# Patient Record
Sex: Male | Born: 2000 | Race: Black or African American | Hispanic: No | Marital: Single | Smoking: Current some day smoker
Health system: Southern US, Community
[De-identification: ages and names within clinical notes are randomized; demographics above are authoritative.]

## PROBLEM LIST (undated history)

## (undated) DIAGNOSIS — L509 Urticaria, unspecified: Secondary | ICD-10-CM

## (undated) DIAGNOSIS — I10 Essential (primary) hypertension: Secondary | ICD-10-CM

## (undated) HISTORY — DX: Urticaria, unspecified: L50.9

---
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
## 2019-04-20 ENCOUNTER — Emergency Department (HOSPITAL_COMMUNITY)
Admission: EM | Admit: 2019-04-20 | Discharge: 2019-04-20 | Disposition: A | Discharge status: Home / Self Care | Payer: Self-pay | Attending: Emergency Medicine | Admitting: Emergency Medicine

## 2019-04-20 ENCOUNTER — Other Ambulatory Visit: Payer: Self-pay

## 2019-04-20 ENCOUNTER — Encounter (HOSPITAL_COMMUNITY): Payer: Self-pay | Admitting: Emergency Medicine

## 2019-04-20 ENCOUNTER — Emergency Department (HOSPITAL_COMMUNITY): Payer: Self-pay

## 2019-04-20 DIAGNOSIS — F12188 Cannabis abuse with other cannabis-induced disorder: Secondary | ICD-10-CM | POA: Insufficient documentation

## 2019-04-20 DIAGNOSIS — R1012 Left upper quadrant pain: Secondary | ICD-10-CM

## 2019-04-20 DIAGNOSIS — I10 Essential (primary) hypertension: Secondary | ICD-10-CM | POA: Insufficient documentation

## 2019-04-20 DIAGNOSIS — R072 Precordial pain: Secondary | ICD-10-CM | POA: Insufficient documentation

## 2019-04-20 DIAGNOSIS — R112 Nausea with vomiting, unspecified: Secondary | ICD-10-CM

## 2019-04-20 LAB — BASIC METABOLIC PANEL
Anion gap: 14 (ref 5–15)
BUN: 13 mg/dL (ref 6–20)
CO2: 22 mmol/L (ref 22–32)
Calcium: 9.6 mg/dL (ref 8.9–10.3)
Chloride: 105 mmol/L (ref 98–111)
Creatinine, Ser: 1.18 mg/dL (ref 0.61–1.24)
GFR calc Af Amer: >60 mL/min (ref >60)
GFR calc non Af Amer: >60 mL/min (ref >60)
Glucose, Bld: 134 mg/dL — ABNORMAL HIGH (ref 70–99)
Potassium: 3.6 mmol/L (ref 3.5–5.1)
Sodium: 141 mmol/L (ref 135–145)

## 2019-04-20 LAB — HEPATIC FUNCTION PANEL
ALT: 41 U/L (ref 0–44)
AST: 26 U/L (ref 15–41)
Albumin: 2.4 g/dL — ABNORMAL LOW (ref 3.5–5.0)
Alkaline Phosphatase: 154 U/L — ABNORMAL HIGH (ref 38–126)
Bilirubin, Direct: <0.1 mg/dL (ref 0.0–0.2)
Total Bilirubin: 0.5 mg/dL (ref 0.3–1.2)
Total Protein: 7.1 g/dL (ref 6.5–8.1)

## 2019-04-20 LAB — CBC
HCT: 43.1 % (ref 39.0–52.0)
Hemoglobin: 14.8 g/dL (ref 13.0–17.0)
MCH: 29.2 pg (ref 26.0–34.0)
MCHC: 34.3 g/dL (ref 30.0–36.0)
MCV: 85 fL (ref 80.0–100.0)
Platelets: 276 10*3/uL (ref 150–400)
RBC: 5.07 MIL/uL (ref 4.22–5.81)
RDW: 13 % (ref 11.5–15.5)
WBC: 18.2 10*3/uL — ABNORMAL HIGH (ref 4.0–10.5)
nRBC: 0 % (ref 0.0–0.2)

## 2019-04-20 LAB — RAPID URINE DRUG SCREEN, HOSP PERFORMED
Amphetamines: NOT DETECTED
Barbiturates: NOT DETECTED
Benzodiazepines: NOT DETECTED
Cocaine: NOT DETECTED
Opiates: NOT DETECTED
Tetrahydrocannabinol: POSITIVE — AB

## 2019-04-20 LAB — LIPASE, BLOOD: Lipase: 96 U/L — ABNORMAL HIGH (ref 11–51)

## 2019-04-20 LAB — TROPONIN I (HIGH SENSITIVITY)
Troponin I (High Sensitivity): 3 ng/L (ref <18)
Troponin I (High Sensitivity): 3 ng/L (ref <18)

## 2019-04-20 MED ORDER — SODIUM CHLORIDE 0.9 % IV BOLUS
1000.0000 mL | Freq: Once | INTRAVENOUS | Status: AC
  Administered 2019-04-20: 1000 mL via INTRAVENOUS

## 2019-04-20 MED ORDER — IOHEXOL 300 MG/ML  SOLN
100.0000 mL | Freq: Once | INTRAMUSCULAR | Status: AC | PRN
  Administered 2019-04-20: 100 mL via INTRAVENOUS

## 2019-04-20 MED ORDER — METOCLOPRAMIDE HCL 5 MG/ML IJ SOLN
10.0000 mg | Freq: Once | INTRAMUSCULAR | Status: AC
  Administered 2019-04-20: 10 mg via INTRAVENOUS
  Filled 2019-04-20: qty 2

## 2019-04-20 NOTE — ED Notes (Signed)
Patient transported to CT 

## 2019-04-20 NOTE — ED Triage Notes (Signed)
Patient arrived with EMS from home reports central chest pain with multiple emesis /diaphoresis and lethargy after eating at [facility] this evening . CBG= 154 by EMS

## 2019-04-20 NOTE — ED Notes (Signed)
Jonathan Solomon [phone] mother looking for an update

## 2019-04-20 NOTE — ED Notes (Signed)
Discharge instructions discussed with pt. Pt verbalized understanding with no questions at this time.   RN did no update mother per patient request. Pt to update family.

## 2019-04-20 NOTE — ED Notes (Signed)
Patient transported to X-ray 

## 2019-04-20 NOTE — ED Notes (Signed)
Mother at bedside leaving at this time. Asks staff to call with updates. RN informed mother she is also able to call requesting updates, with Son's approval.

## 2019-04-20 NOTE — ED Provider Notes (Signed)
[facility] EMERGENCY DEPARTMENT Provider Note  CSN: [id number] Arrival date & time: 04/20/19 0014  Chief Complaint(s) Chest Pain and Food Poisoning  HPI Jonathan Solomon is a 18 y.o. male with a history of hypertension who presents to the emergency department with 1 hour of left upper quadrant and substernal chest achiness following episode of recurrent nonbloody nonbilious emesis.  No associated shortness of breath.  Pain not exertional and nonradiating.  Currently pain-free.  Patient reports that he ate takeout food around 2 PM.  No other known suspicious food intake.  Patient denies any known sick contacts.  Denies any infectious symptoms.  No fevers or chills.  No coughing or congestion.  When asked about illicit drug use patient denied stating that he did not remember whether he smoked marijuana or not.  When pressed, patient reported that he might of smoked a "roach."  He denied any alcohol use.  HPI  Past Medical History Past Medical History:  Diagnosis Date  . Hypertension   . Urticaria    Patient Active Problem List   Diagnosis Date Noted  . 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 Metacarpal bone fracture 01/31/2013   Home Medication(s) Prior to Admission medications   Not on File                                                                                                                                    Past Surgical History History reviewed. No pertinent surgical history. Family History Family History  Problem Relation Age of Onset  . Asthma Paternal Grandmother   . Diabetes Paternal Grandmother     Social History Social History   Tobacco Use  . Smoking status: Never Smoker  . Smokeless tobacco: Never Used  Substance Use Topics  . Alcohol use: No  . Drug use: No   Allergies Strawberry extract and Chocolate  Review of Systems Review of Systems All other systems are reviewed and are negative for acute change except as  noted in the HPI  Physical Exam Vital Signs  I have reviewed the triage vital signs BP (!) 146/51 (BP Location: Right Arm)   Pulse 79   Temp 98.3 F (36.8 C) (Oral)   Resp (!) 25   Ht 5' 7"  (1.702 m)   Wt 87.5 kg   SpO2 97%   BMI 30.23 kg/m   Physical Exam Vitals signs reviewed.  Constitutional:      General: He is not in acute distress.    Appearance: He is well-developed. He is not diaphoretic.  HENT:     Head: Normocephalic and atraumatic.     Nose: Nose normal.  Eyes:     General: No scleral icterus.       Right eye: No discharge.        Left eye: No discharge.     Conjunctiva/sclera: Conjunctivae normal.     Pupils: Pupils are equal,  round, and reactive to light.  Neck:     Musculoskeletal: Normal range of motion and neck supple.  Cardiovascular:     Rate and Rhythm: Normal rate and regular rhythm.     Heart sounds: No murmur. No friction rub. No gallop.   Pulmonary:     Effort: Pulmonary effort is normal. No respiratory distress.     Breath sounds: Normal breath sounds. No stridor. No rales.  Abdominal:     General: There is no distension.     Palpations: Abdomen is soft.     Tenderness: There is no abdominal tenderness.  Musculoskeletal:        General: No tenderness.  Skin:    General: Skin is warm and dry.     Findings: No erythema or rash.  Neurological:     Mental Status: He is alert and oriented to person, place, and time.     Comments: Slow to respond     ED Results and Treatments Labs (all labs ordered are listed, but only abnormal results are displayed) Labs Reviewed  BASIC METABOLIC PANEL - Abnormal; Notable for the following components:      Result Value   Glucose, Bld 134 (*)    All other components within normal limits  CBC - Abnormal; Notable for the following components:   WBC 18.2 (*)    All other components within normal limits  HEPATIC FUNCTION PANEL - Abnormal; Notable for the following components:   Albumin 2.4 (*)     Alkaline Phosphatase 154 (*)    All other components within normal limits  LIPASE, BLOOD - Abnormal; Notable for the following components:   Lipase 96 (*)    All other components within normal limits  RAPID URINE DRUG SCREEN, HOSP PERFORMED - Abnormal; Notable for the following components:   Tetrahydrocannabinol POSITIVE (*)    All other components within normal limits  TROPONIN I (HIGH SENSITIVITY)  TROPONIN I (HIGH SENSITIVITY)                                                                                                                         EKG    Radiology Dg Chest 2 View  Result Date: 04/20/2019 CLINICAL DATA:  Chest pain EXAM: CHEST - 2 VIEW COMPARISON:  08/27/2015 FINDINGS: The heart size and mediastinal contours are within normal limits. Both lungs are clear. The visualized skeletal structures are unremarkable. IMPRESSION: No active cardiopulmonary disease. Electronically Signed   By: Ulyses Jarred M.D.   On: 04/20/2019 02:04   Ct Abdomen Pelvis W Contrast  Result Date: 04/20/2019 CLINICAL DATA:  Pancreatitis suspected, atypical presentation EXAM: CT ABDOMEN AND PELVIS WITH CONTRAST TECHNIQUE: Multidetector CT imaging of the abdomen and pelvis was performed using the standard protocol following bolus administration of intravenous contrast. CONTRAST:  156m OMNIPAQUE IOHEXOL 300 MG/ML  SOLN COMPARISON:  Same day chest radiograph, renal ultrasound 11/02/2013 FINDINGS: Lower chest: Lung bases are clear. Normal heart size. No pericardial effusion. Hepatobiliary: Diffuse hepatic  hypoattenuation compatible with hepatic steatosis. No focal liver abnormality is seen. No gallstones, gallbladder wall thickening, or biliary dilatation. Pancreas: No significant peripancreatic inflammation or ductal dilatation is seen. No visible pancreatic lesion. Spleen: Normal in size without focal abnormality. Adrenals/Urinary Tract: Adrenal glands are unremarkable. Kidneys are normal, without renal  calculi, focal lesion, or hydronephrosis. Bladder is unremarkable. Stomach/Bowel: Distal esophagus, stomach and duodenal sweep are unremarkable. No small bowel wall thickening or dilatation. Small fecalization of the distal small bowel contents. A normal appendix is visualized. No colonic dilatation or wall thickening. Scattered colonic diverticula without focal pericolonic inflammation to suggest diverticulitis. No evidence of obstruction Vascular/Lymphatic: The aorta is normal caliber. No suspicious or enlarged lymph nodes in the included lymphatic chains. Reproductive: The prostate and seminal vesicles are unremarkable. Other: No abdominopelvic free fluid or free gas. No bowel containing hernias. Musculoskeletal: No acute osseous abnormality or suspicious osseous lesion. IMPRESSION: 1. No CT evidence for acute pancreatitis. 2. Fecalization of the distal small bowel contents may reflect slowed intestinal transit. 3. Hepatic steatosis. 4. Diverticulosis without evidence for diverticulitis. Electronically Signed   By: Lovena Le M.D.   On: 04/20/2019 04:21    Pertinent labs & imaging results that were available during my care of the patient were reviewed by me and considered in my medical decision making (see chart for details).  Medications Ordered in ED Medications  metoCLOPramide (REGLAN) injection 10 mg (10 mg Intravenous Given 04/20/19 0205)  sodium chloride 0.9 % bolus 1,000 mL (0 mLs Intravenous Stopped 04/20/19 0321)  iohexol (OMNIPAQUE) 300 MG/ML solution 100 mL (100 mLs Intravenous Contrast Given 04/20/19 0349)                                                                                                                                    Procedures Procedures  (including critical care time)  Medical Decision Making / ED Course I have reviewed the nursing notes for this encounter and the patient's prior records (if available in EHR or on provided paperwork).   Jonathan Solomon was  evaluated in Emergency Department on 04/20/2019 for the symptoms described in the history of present illness. He was evaluated in the context of the global COVID-19 pandemic, which necessitated consideration that the patient might be at risk for infection with the SARS-CoV-2 virus that causes COVID-19. Institutional protocols and algorithms that pertain to the evaluation of patients at risk for COVID-19 are in a state of rapid change based on information released by regulatory bodies including the CDC and federal and state organizations. These policies and algorithms were followed during the patient's care in the ED.  Labs notable for elevated lipase and alk phose with luekocytosis. CT to rule out stone pancreatitis. - negative. Likely related to [facility] use or food poisoning (though less likely given the timeline) EKG nonischemic and delta trops negative. Doubt ACS.  Treated with IVF, nausea meds. Tolerating oral intake.  Chest x-ray  without evidence suggestive of pneumonia, pneumothorax, pneumomediastinum.  No abnormal contour of the mediastinum to suggest dissection. No evidence of acute injuries.  The patient appears reasonably screened and/or stabilized for discharge and I doubt any other medical condition or other [facility] requiring further screening, evaluation, or treatment in the ED at this time prior to discharge.  The patient is safe for discharge with strict return precautions.      Final Clinical Impression(s) / ED Diagnoses Final diagnoses:  LUQ pain  Precordial chest pain  Nausea and vomiting in adult  Cannabinoid hyperemesis syndrome    The patient appears reasonably screened and/or stabilized for discharge and I doubt any other medical condition or other [facility] requiring further screening, evaluation, or treatment in the ED at this time prior to discharge.  Disposition: Discharge  Condition: Good  I have discussed the results, Dx and Tx plan with the patient who expressed  understanding and agree(s) with the plan. Discharge instructions discussed at great length. The patient was given strict return precautions who verbalized understanding of the instructions. No further questions at time of discharge.    ED Discharge Orders    None        Follow Up: Sinda Du, MD [place]  [id number] [phone]  Schedule an appointment as soon as possible for a visit        This chart was dictated using voice recognition software.  Despite best efforts to proofread,  errors can occur which can change the documentation meaning.   Fatima Blank, MD 04/20/19 [phone]

## 2020-02-27 IMAGING — CT CT ABD-PELV W/ CM
2 of 4 series · 15 of 46 positions shown, 17 images · IV contrast (omnipaque)
Comparison: Same day chest radiograph, renal ultrasound 11/02/2013

CLINICAL DATA: Pancreatitis suspected, atypical presentation

EXAM:
CT ABDOMEN AND PELVIS WITH CONTRAST
TECHNIQUE: Multidetector CT imaging of the abdomen and pelvis was performed
using the standard protocol following bolus administration of
intravenous contrast.
CONTRAST:  100mL OMNIPAQUE IOHEXOL 300 MG/ML  SOLN

[Series 3: a/p w/ 5mm · axial · 0.91mm/px · z∈[+742,+1182]mm · 12 of 105 slices shown, 14 images]
[im 9/105  soft-tissue]
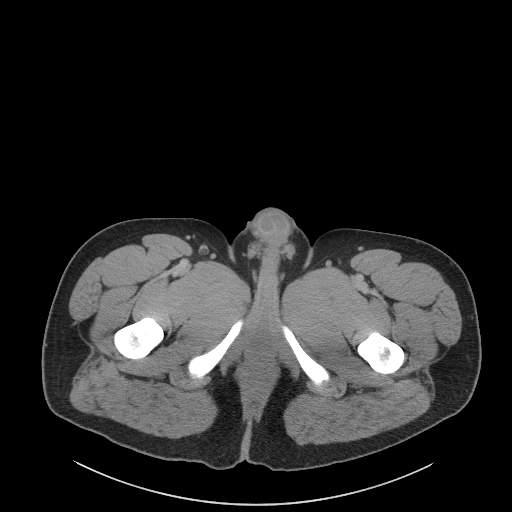
[im 9/105  bone]
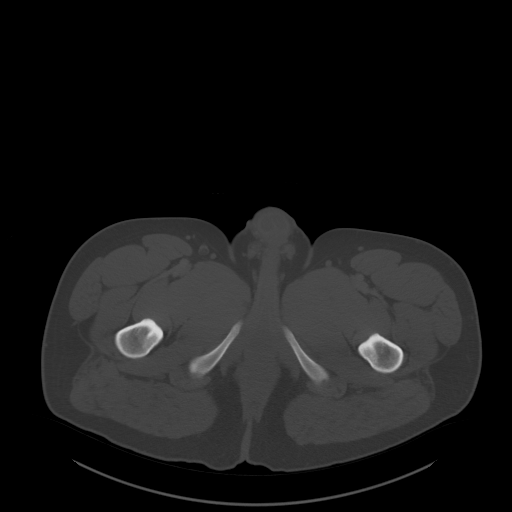
[im 17/105  soft-tissue]
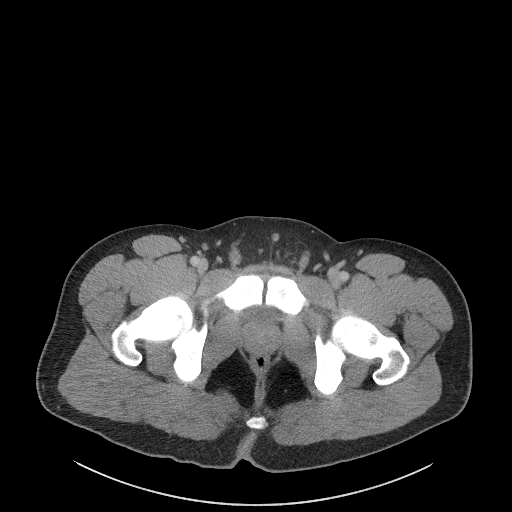
[im 25/105  soft-tissue]
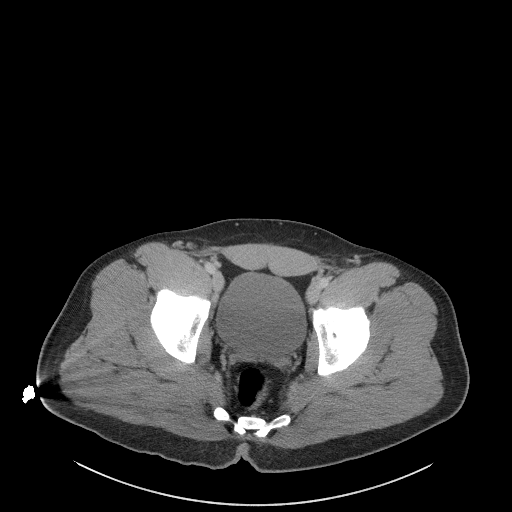
[im 33/105  soft-tissue]
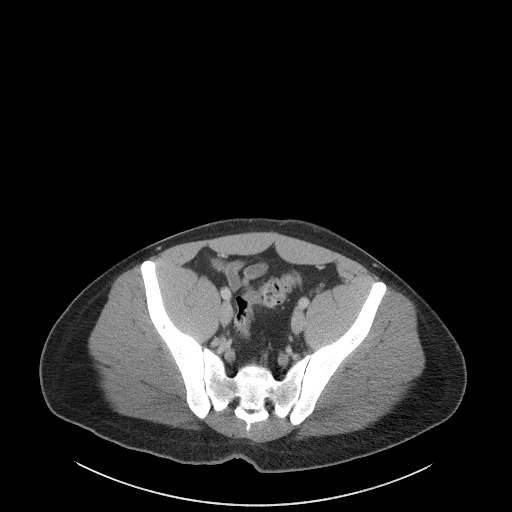
[im 41/105  soft-tissue]
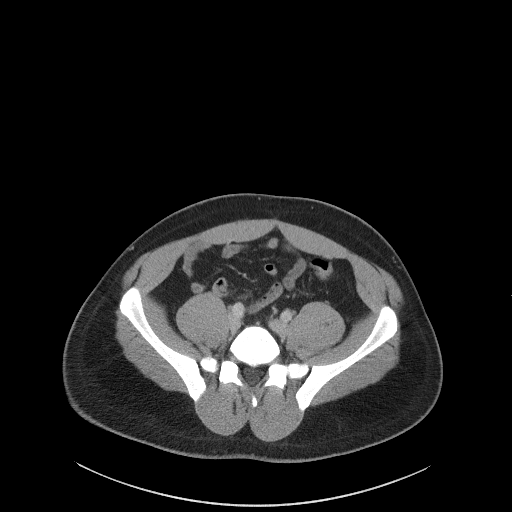
[im 49/105  soft-tissue]
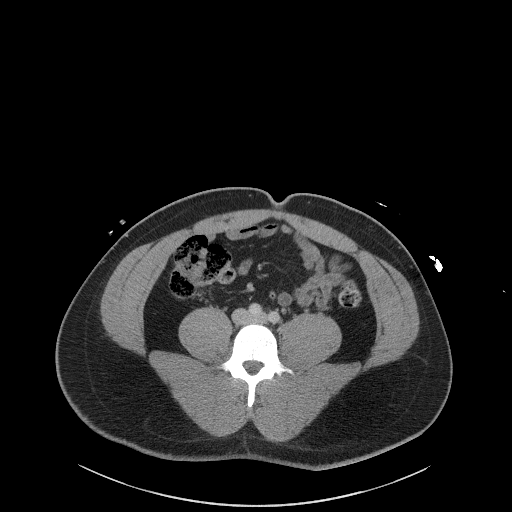
[im 57/105  soft-tissue]
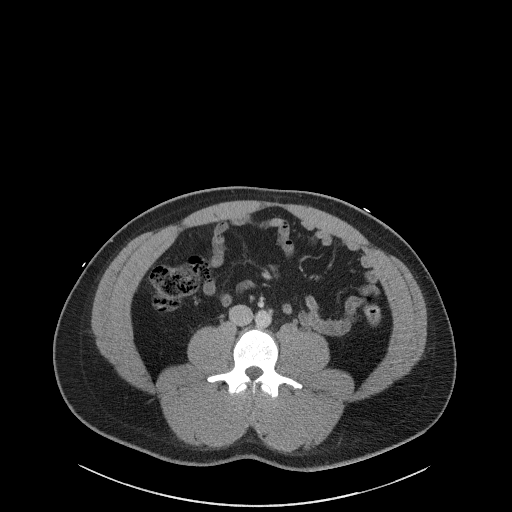
[im 65/105  soft-tissue]
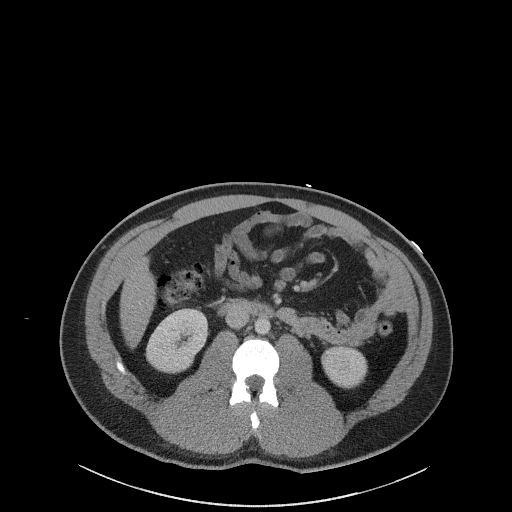
[im 73/105  soft-tissue]
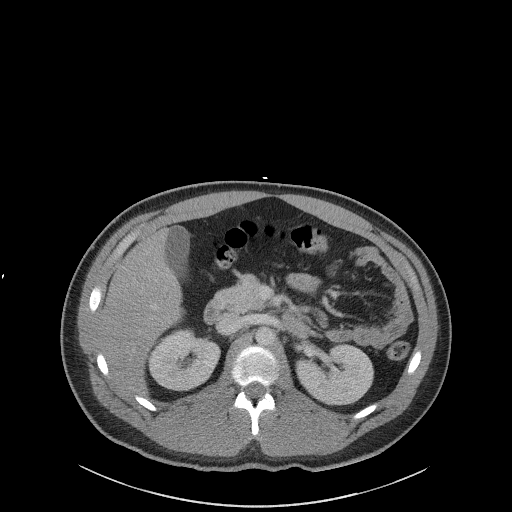
[im 73/105  bone]
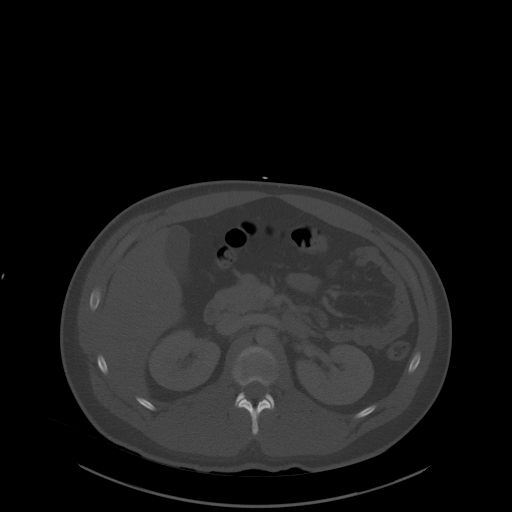
[im 81/105  soft-tissue]
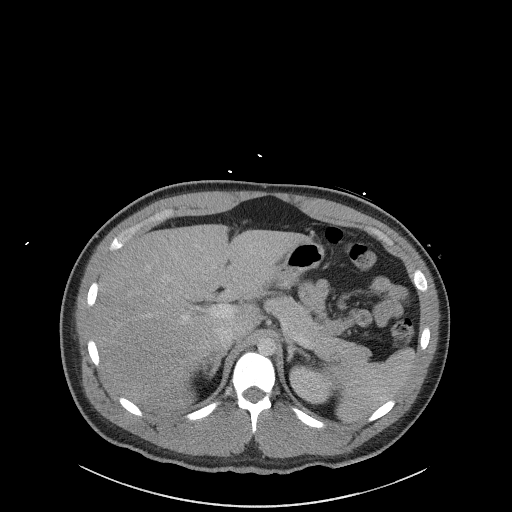
[im 89/105  soft-tissue]
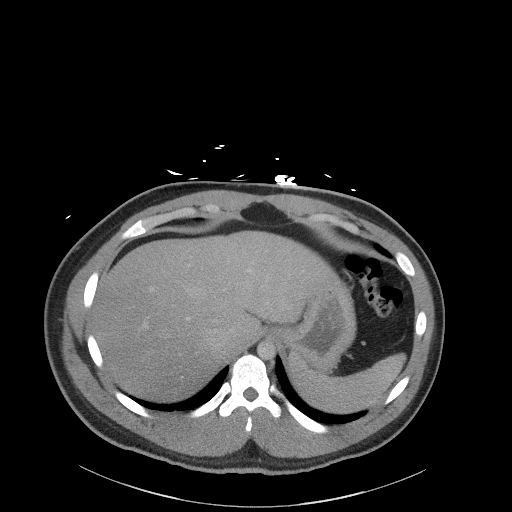
[im 97/105  soft-tissue]
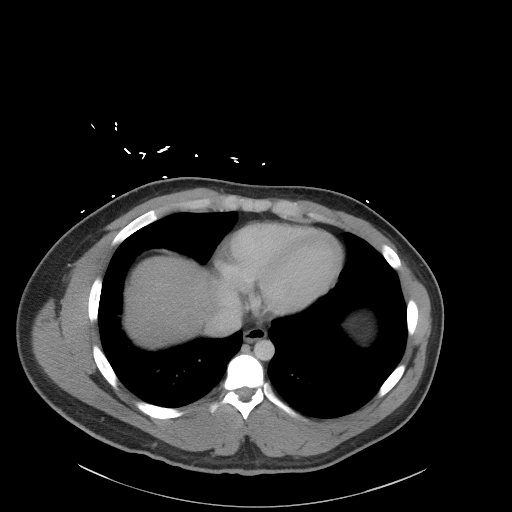

[Series 6: a/p w/ cor · coronal · 0.74mm/px · 3 of 132 slices shown]
[im 44/132  soft-tissue]
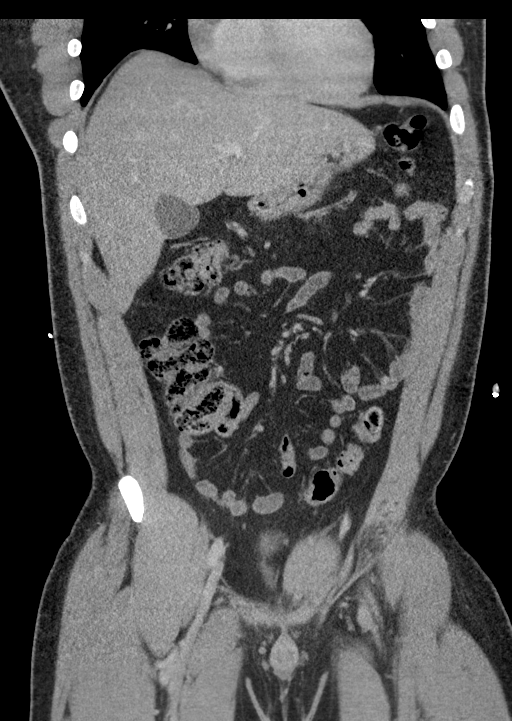
[im 59/132  soft-tissue]
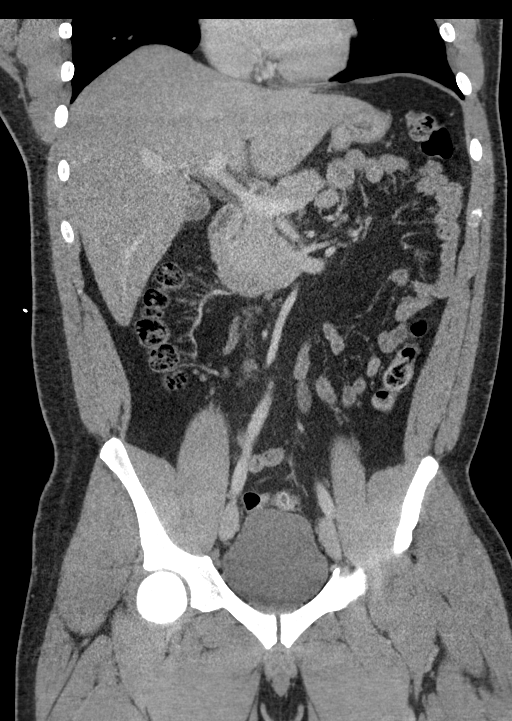
[im 73/132  soft-tissue]
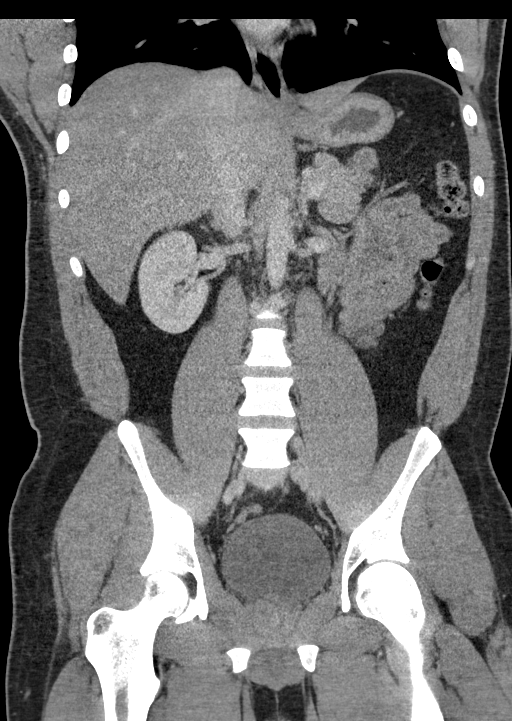

[15 of 46 positions shown; findings below may reference images not displayed]

FINDINGS: Lower chest: Lung bases are clear. Normal heart size. No pericardial
effusion.

Hepatobiliary: Diffuse hepatic hypoattenuation compatible with
hepatic steatosis. No focal liver abnormality is seen. No
gallstones, gallbladder wall thickening, or biliary dilatation.

Pancreas: No significant peripancreatic inflammation or ductal
dilatation is seen. No visible pancreatic lesion.

Spleen: Normal in size without focal abnormality.

Adrenals/Urinary Tract: Adrenal glands are unremarkable. Kidneys are
normal, without renal calculi, focal lesion, or hydronephrosis.
Bladder is unremarkable.

Stomach/Bowel: Distal esophagus, stomach and duodenal sweep are
unremarkable. No small bowel wall thickening or dilatation. Small
fecalization of the distal small bowel contents. A normal appendix
is visualized. No colonic dilatation or wall thickening. Scattered
colonic diverticula without focal pericolonic inflammation to
suggest diverticulitis. No evidence of obstruction

Vascular/Lymphatic: The aorta is normal caliber. No suspicious or
enlarged lymph nodes in the included lymphatic chains.

Reproductive: The prostate and seminal vesicles are unremarkable.

Other: No abdominopelvic free fluid or free gas. No bowel containing
hernias.

Musculoskeletal: No acute osseous abnormality or suspicious osseous
lesion.
IMPRESSION: 1. No CT evidence for acute pancreatitis.
2. Fecalization of the distal small bowel contents may reflect
slowed intestinal transit.
3. Hepatic steatosis.
4. Diverticulosis without evidence for diverticulitis.

## 2020-05-11 ENCOUNTER — Encounter (HOSPITAL_COMMUNITY): Payer: Self-pay | Admitting: Emergency Medicine

## 2020-05-11 ENCOUNTER — Emergency Department (HOSPITAL_COMMUNITY)
Admission: EM | Admit: 2020-05-11 | Discharge: 2020-05-11 | Disposition: A | Discharge status: Home / Self Care | Payer: Self-pay | Attending: Emergency Medicine | Admitting: Emergency Medicine

## 2020-05-11 ENCOUNTER — Emergency Department (HOSPITAL_COMMUNITY): Payer: Self-pay

## 2020-05-11 ENCOUNTER — Other Ambulatory Visit: Payer: Self-pay

## 2020-05-11 DIAGNOSIS — R42 Dizziness and giddiness: Secondary | ICD-10-CM | POA: Insufficient documentation

## 2020-05-11 DIAGNOSIS — I10 Essential (primary) hypertension: Secondary | ICD-10-CM | POA: Insufficient documentation

## 2020-05-11 DIAGNOSIS — R0789 Other chest pain: Secondary | ICD-10-CM | POA: Insufficient documentation

## 2020-05-11 DIAGNOSIS — R1012 Left upper quadrant pain: Secondary | ICD-10-CM | POA: Insufficient documentation

## 2020-05-11 MED ORDER — SIMETHICONE 80 MG PO CHEW
80.0000 mg | CHEWABLE_TABLET | Freq: Once | ORAL | Status: AC
  Administered 2020-05-11: 80 mg via ORAL
  Filled 2020-05-11: qty 1

## 2020-05-11 MED ORDER — ALUM & MAG HYDROXIDE-SIMETH 200-200-20 MG/5ML PO SUSP
30.0000 mL | Freq: Once | ORAL | Status: AC
  Administered 2020-05-11: 30 mL via ORAL
  Filled 2020-05-11: qty 30

## 2020-05-11 MED ORDER — ONDANSETRON 4 MG PO TBDP
4.0000 mg | ORAL_TABLET | Freq: Once | ORAL | Status: AC
  Administered 2020-05-11: 4 mg via ORAL
  Filled 2020-05-11: qty 1

## 2020-05-11 NOTE — Discharge Instructions (Addendum)
You were evaluated in the Emergency Department and after careful evaluation, we did not find any emergent condition requiring admission or further testing in the hospital.  Your exam/testing today was overall reassuring.  Please return to the Emergency Department if you experience any worsening of your condition.  Thank you for allowing us to be a part of your care.  

## 2020-05-11 NOTE — ED Triage Notes (Signed)
Pt c/o abd pain and chest pain after eating [place] food and smoking a blunt.

## 2020-05-11 NOTE — ED Provider Notes (Signed)
[facility]-EMERG[facility]Y DEPT [facility] Emergency Department Provider Note MRN:  [id number]  Arrival date & time: 05/11/20     Chief Complaint   Abdominal Pain and Chest Pain   History of Present Illness   Jonathan Solomon is a 19 y.o. year-old male with a history of hypertension presenting to the ED with chief complaint of abdominal pain and chest pain.  Patient ate a burrito and then smoked some marijuana and shortly afterward began experiencing some left upper quadrant and chest discomfort, feels very bloated, explains that he is unable to vomit.  Feeling lightheaded, generally unwell, weak.  Denies headache or vision change, no other symptoms.  Symptoms are constant, moderate to severe, no other exacerbating or alleviating factors.  Review of Systems  A complete 10 system review of systems was obtained and all systems are negative except as noted in the HPI and PMH.   Patient's Health History    Past Medical History:  Diagnosis Date  . Hypertension   . Urticaria     History reviewed. No pertinent surgical history.  Family History  Problem Relation Age of Onset  . Asthma Paternal Grandmother   . Diabetes Paternal Grandmother     Social History   Socioeconomic History  . Marital status: Single    Spouse name: Not on file  . Number of children: Not on file  . Years of education: Not on file  . Highest education level: Not on file  Occupational History  . Not on file  Tobacco Use  . Smoking status: Never Smoker  . Smokeless tobacco: Never Used  Substance and Sexual Activity  . Alcohol use: No  . Drug use: No  . Sexual activity: Not on file  Other Topics Concern  . Not on file  Social History Narrative  . Not on file   Social Determinants of Health   Financial Resource Strain: Not on file  Food Insecurity: Not on file  Transportation Needs: Not on file  Physical Activity: Not on file  Stress: Not on file  Social Connections: Not on file  Intimate Partner  Violence: Not on file     Physical Exam   Vitals:   05/11/20 0310 05/11/20 0430  BP: (!) 144/66 136/69  Pulse: 88 78  Resp: 10 15  Temp: 98.2 F (36.8 C)   SpO2: 97% 99%    CONSTITUTIONAL: Well-appearing, NAD NEURO:  Alert and oriented x 3, no focal deficits EYES:  eyes equal and reactive ENT/NECK:  no LAD, no JVD CARDIO: Regular rate, well-perfused, normal S1 and S2 PULM:  CTAB no wheezing or rhonchi GI/GU:  normal bowel sounds, non-distended, non-tender MSK/SPINE:  No gross deformities, no edema SKIN:  no rash, atraumatic PSYCH:  Appropriate speech and behavior  *Additional and/or pertinent findings included in MDM below  Diagnostic and Interventional Summary    EKG Interpretation  Date/Time:  Saturday May 11 2020 02:32:46 EST Ventricular Rate:  95 PR Interval:    QRS Duration: 88 QT Interval:  333 QTC Calculation: 419 R Axis:   47 Text Interpretation: Sinus rhythm Probable left atrial enlargement RSR' in V1 or V2, probably normal variant Borderline T abnormalities, diffuse leads Confirmed by Kennis Carina [phone]) on 05/11/2020 4:50:32 AM      Labs Reviewed - No data to display  DG Chest Port 1 View  Final Result      Medications  ondansetron (ZOFRAN-ODT) disintegrating tablet 4 mg (4 mg Oral Given 05/11/20 0230)  simethicone (MYLICON) chewable tablet 80 mg (80  mg Oral Given 05/11/20 0230)  alum & mag hydroxide-simeth (MAALOX/MYLANTA) 200-200-20 MG/5ML suspension 30 mL (30 mLs Oral Given 05/11/20 0404)     Procedures  /  Critical Care Procedures  ED Course and Medical Decision Making  I have reviewed the triage vital signs, the nursing notes, and pertinent available records from the EMR.  Listed above are laboratory and imaging tests that I personally ordered, reviewed, and interpreted and then considered in my medical decision making (see below for details).  Suspect bloating and discomfort related to delayed gastric emptying after large meal and  marijuana, will attempt symptomatic management and observation.  Given the chest pain will screen with EKG and chest x-ray.     EKG and chest x-ray are reassuring, patient is feeling much better, appropriate for discharge.  Elmer Sow. Pilar Plate, MD [facility] Emergency Medicine [facility] [email or web address]  Final Clinical Impressions(s) / ED Diagnoses     ICD-10-CM   1. Left upper quadrant abdominal pain  R10.12     ED Discharge Orders    None       Discharge Instructions Discussed with and Provided to Patient:     Discharge Instructions     You were evaluated in the Emergency Department and after careful evaluation, we did not find any emergent condition requiring admission or further testing in the hospital.  Your exam/testing today was overall reassuring.  Please return to the Emergency Department if you experience any worsening of your condition.  Thank you for allowing [place] to be a part of your care.        Sabas Sous, MD 05/11/20 [phone]

## 2021-03-20 IMAGING — DX DG CHEST 1V PORT
1 series · 1 of 1 positions shown · non-contrast
Comparison: April 20, 2019

CLINICAL DATA: Abdominal pain and chest pain.

EXAM:
PORTABLE CHEST 1 VIEW

[chest ap]
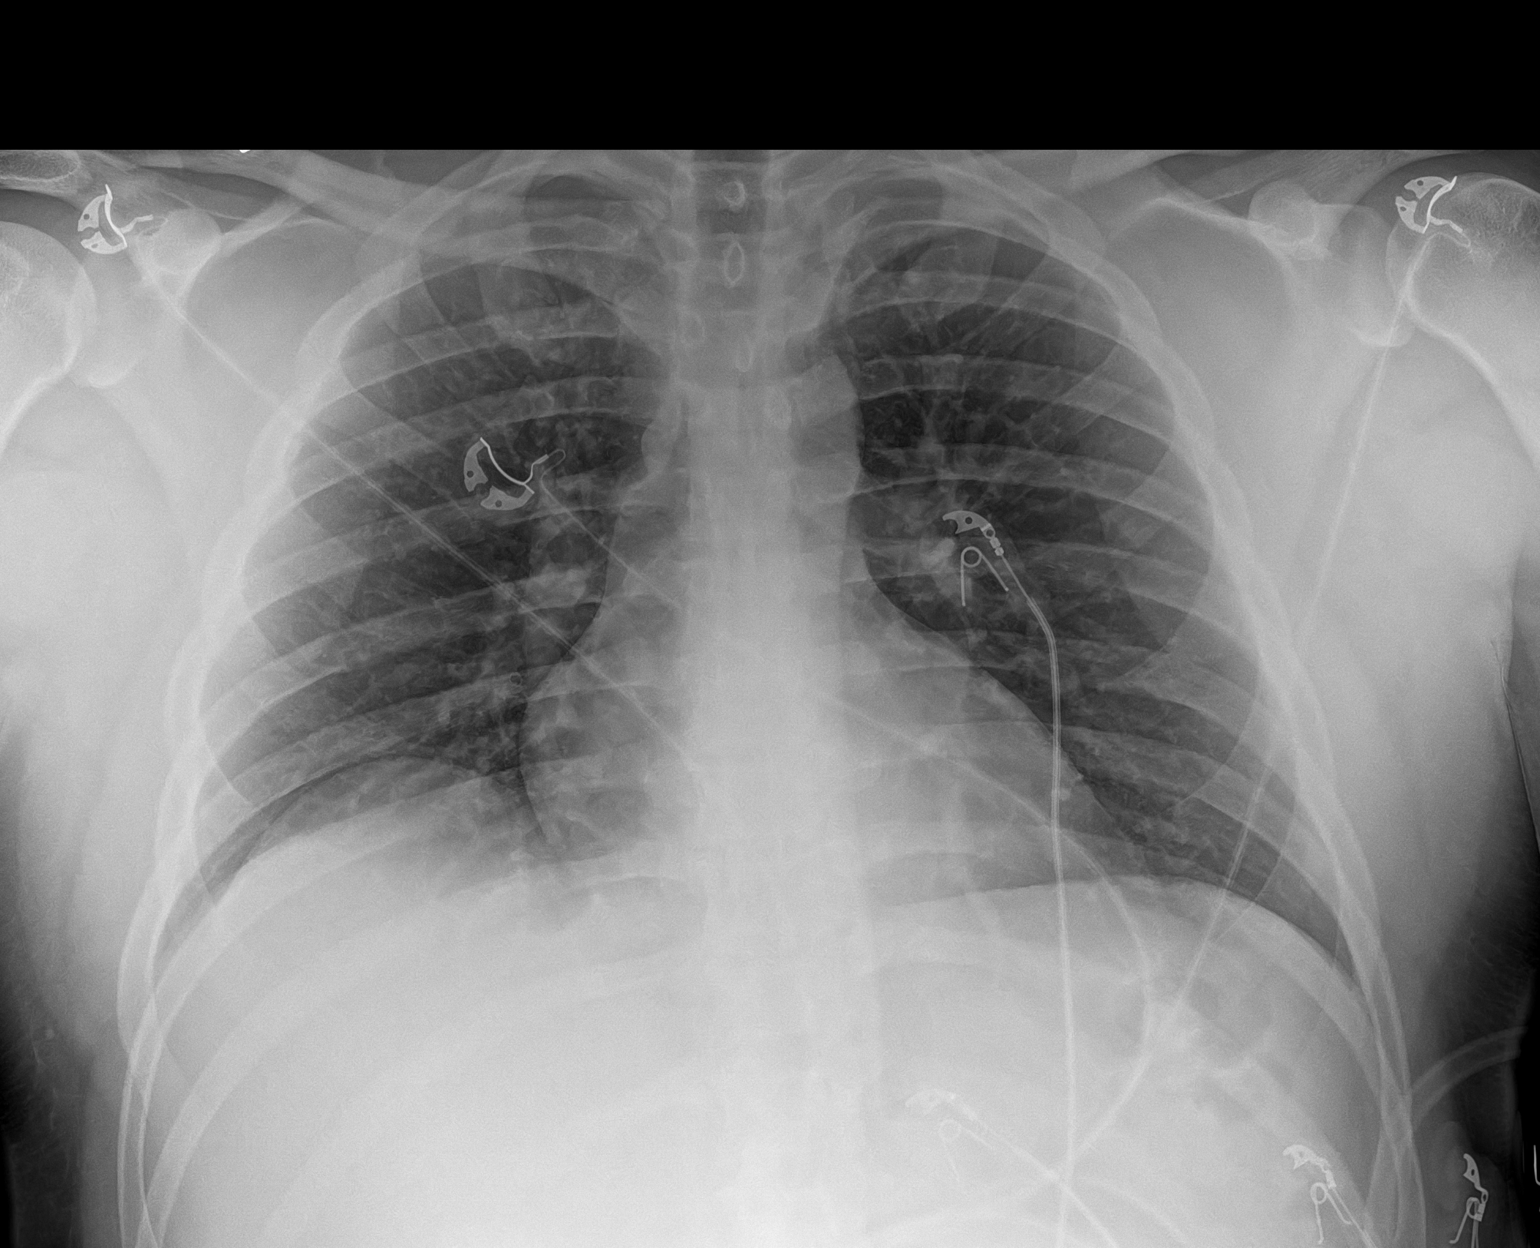

[1 of 1 positions shown; findings below may reference images not displayed]

FINDINGS: The heart size and mediastinal contours are within normal limits.
Both lungs are clear. The visualized skeletal structures are
unremarkable.
IMPRESSION: No active disease.

## 2021-10-01 ENCOUNTER — Emergency Department (HOSPITAL_COMMUNITY): Payer: Self-pay

## 2021-10-01 ENCOUNTER — Other Ambulatory Visit: Payer: Self-pay

## 2021-10-01 ENCOUNTER — Emergency Department (HOSPITAL_COMMUNITY)
Admission: EM | Admit: 2021-10-01 | Discharge: 2021-10-01 | Disposition: A | Discharge status: Home / Self Care | Payer: Self-pay | Attending: Emergency Medicine | Admitting: Emergency Medicine

## 2021-10-01 DIAGNOSIS — R0789 Other chest pain: Secondary | ICD-10-CM | POA: Insufficient documentation

## 2021-10-01 DIAGNOSIS — R110 Nausea: Secondary | ICD-10-CM | POA: Insufficient documentation

## 2021-10-01 DIAGNOSIS — R079 Chest pain, unspecified: Secondary | ICD-10-CM

## 2021-10-01 DIAGNOSIS — F17210 Nicotine dependence, cigarettes, uncomplicated: Secondary | ICD-10-CM | POA: Insufficient documentation

## 2021-10-01 DIAGNOSIS — I10 Essential (primary) hypertension: Secondary | ICD-10-CM | POA: Insufficient documentation

## 2021-10-01 DIAGNOSIS — R519 Headache, unspecified: Secondary | ICD-10-CM | POA: Insufficient documentation

## 2021-10-01 LAB — CBC
HCT: 46.7 % (ref 39.0–52.0)
Hemoglobin: 15.8 g/dL (ref 13.0–17.0)
MCH: 28.6 pg (ref 26.0–34.0)
MCHC: 33.8 g/dL (ref 30.0–36.0)
MCV: 84.6 fL (ref 80.0–100.0)
Platelets: 273 10*3/uL (ref 150–400)
RBC: 5.52 MIL/uL (ref 4.22–5.81)
RDW: 13 % (ref 11.5–15.5)
WBC: 8 10*3/uL (ref 4.0–10.5)
nRBC: 0 % (ref 0.0–0.2)

## 2021-10-01 LAB — BASIC METABOLIC PANEL
Anion gap: 10 (ref 5–15)
BUN: 11 mg/dL (ref 6–20)
CO2: 26 mmol/L (ref 22–32)
Calcium: 9.2 mg/dL (ref 8.9–10.3)
Chloride: 100 mmol/L (ref 98–111)
Creatinine, Ser: 0.94 mg/dL (ref 0.61–1.24)
GFR, Estimated: >60 mL/min (ref >60)
Glucose, Bld: 100 mg/dL — ABNORMAL HIGH (ref 70–99)
Potassium: 4.1 mmol/L (ref 3.5–5.1)
Sodium: 136 mmol/L (ref 135–145)

## 2021-10-01 LAB — TROPONIN I (HIGH SENSITIVITY): Troponin I (High Sensitivity): 2 ng/L (ref <18)

## 2021-10-01 MED ORDER — ASPIRIN 325 MG PO TABS
325.0000 mg | ORAL_TABLET | Freq: Every day | ORAL | Status: DC
  Administered 2021-10-01: 325 mg via ORAL
  Filled 2021-10-01: qty 1

## 2021-10-01 MED ORDER — AMLODIPINE BESYLATE 5 MG PO TABS: 5.0000 mg | ORAL_TABLET | Freq: Every day | ORAL | 0 refills | Status: DC

## 2021-10-01 MED ORDER — AMLODIPINE BESYLATE 5 MG PO TABS
5.0000 mg | ORAL_TABLET | Freq: Once | ORAL | Status: AC
  Administered 2021-10-01: 5 mg via ORAL
  Filled 2021-10-01: qty 1

## 2021-10-01 NOTE — ED Provider Notes (Signed)
[facility] EMERGENCY DEPARTMENT Provider Note   CSN: [id number] Arrival date & time: 10/01/21  1555     History  Chief Complaint  Patient presents with   Chest Pain    Jonathan Solomon is a 21 y.o. male.   Chest Pain  Patient presents with concern regarding his blood pressure.  States has been high for the last few weeks, he checks it at home with a monitor.  He is not on any blood pressure medicine, states he knows his blood pressure is high when he gets headaches and feels nauseated.  He is also having left-sided chest pain that happens after working.  He does work in a very physically demanding job and the pain is worse after lifting heavy objects.  Is reproducible with palpation, does not radiate elsewhere.  Patient does have family history of CAD before the age of 89.  He smokes cigarettes currently.  No history of ACS or PEs for him personally.  He is currently not on any medicine.  Home Medications Prior to Admission medications   Medication Sig Start Date End Date Taking? Authorizing Provider  meloxicam (MOBIC) 7.5 MG tablet Take by mouth. 09/29/21  Yes [provider]  methocarbamol (ROBAXIN) 500 MG tablet Take by mouth. 09/29/21 10/09/21 Yes [provider]      Allergies    Strawberry extract and Chocolate    Review of Systems   Review of Systems  Cardiovascular:  Positive for chest pain.   Physical Exam Updated Vital Signs BP (!) 176/109 (BP Location: Left Arm)   Pulse 65   Temp 98.4 F (36.9 C) (Oral)   Resp 18   Ht 5\' 7"  (1.702 m)   Wt 103.5 kg   SpO2 99%   BMI 35.74 kg/m  Physical Exam Vitals and nursing note reviewed. Exam conducted with a chaperone present.  Constitutional:      Appearance: Normal appearance.  HENT:     Head: Normocephalic and atraumatic.  Eyes:     General: No scleral icterus.       Right eye: No discharge.        Left eye: No discharge.     Extraocular Movements: Extraocular movements intact.     Pupils:  Pupils are equal, round, and reactive to light.  Cardiovascular:     Rate and Rhythm: Normal rate and regular rhythm.     Pulses: Normal pulses.     Heart sounds: Normal heart sounds. No murmur heard.   No friction rub. No gallop.  Pulmonary:     Effort: Pulmonary effort is normal. No respiratory distress.     Breath sounds: Normal breath sounds.  Chest:     Chest wall: Tenderness present.     Comments: Pinpoint reproducible tenderness on exam Abdominal:     General: Abdomen is flat. Bowel sounds are normal. There is no distension.     Palpations: Abdomen is soft.     Tenderness: There is no abdominal tenderness.  Skin:    General: Skin is warm and dry.     Coloration: Skin is not jaundiced.  Neurological:     Mental Status: He is alert. Mental status is at baseline.     Coordination: Coordination normal.    ED Results / Procedures / Treatments   Labs (all labs ordered are listed, but only abnormal results are displayed) Labs Reviewed  BASIC METABOLIC PANEL - Abnormal; Notable for the following components:      Result Value   Glucose, Bld  100 (*)    All other components within normal limits  CBC  TROPONIN I (HIGH SENSITIVITY)    EKG None  Radiology DG Chest 2 View  Result Date: 10/01/2021 CLINICAL DATA:  Chest pain. EXAM: CHEST - 2 VIEW COMPARISON:  Chest radiograph 09/29/2021 FINDINGS: Both lungs are clear. Focal eventration along the anterior right hemidiaphragm appears chronic. Heart and mediastinum are within normal limits. Trachea is midline. Negative for a pneumothorax. No acute bone abnormality. IMPRESSION: No active cardiopulmonary disease. Electronically Signed   By: Richarda Overlie M.D.   On: 10/01/2021 19:37    Procedures Procedures    Medications Ordered in ED Medications  aspirin tablet 325 mg (325 mg Oral Given 10/01/21 2007)  amLODipine (NORVASC) tablet 5 mg (has no administration in time range)    ED Course/ Medical Decision Making/ A&P                            Medical Decision Making Amount and/or Complexity of Data Reviewed Radiology: ordered.  Risk OTC drugs. Prescription drug management.   Patient presents primarily due to concerns about blood pressure but is also having chest pain.  Differential includes hypertensive emergency, urgency, ACS, PE, costochondritis.  Given the reproducibility of it and the fact is worse after muscle I do think the chest pain is most likely secondary to costochondritis/MSK.  Work-up overall reassuring, no hypoxia or tachypnea.  Lung sounds are clear to auscultation bilaterally.  EKG shows sinus rhythm without any ischemic findings, no LVH.  I ordered and viewed the chest x-ray.  Agree with radiology interpretation of no acute findings, specifically no cardiomegaly concerning for early onset CHF.  I ordered, viewed and interpreted laboratory work-up.  No leukocytosis or anemia, BMP is without gross electrolyte derangement or AKI.  Troponin negative at 2.  Patient has remained persistently hypertensive, initially 187/106.  I did order him aspirin, on reevaluation patient's blood pressure was 176/109.  This is his second visit to the ED due to concerns about hypertension.    I did review external records, he was seen 2 days ago and his blood pressure was 158/98.  Patient showed me his blood pressure readings and they have been in the 180s to 200s over the past few days although it does go down to the 150/100 per his record.  I did discuss that typically lifestyle changes are recommended as first-line medicine.  Patient and I engaged in shared decision-making eluding risk of side effects from antihypertensive medicine as well as the fact he is very young and smoking cessation is typically first-line as well as lifestyle intervention such as eating healthy and exercise.  Patient states he is going to continue doing those but is requesting antihypertensive medicine.  I will start him on 1 month of amlodipine,  first dose given in the ED and was tolerated well.  He has an appointment to establish care with a PCP at the end of this month, if indicated they can decide to continue him on it.        Final Clinical Impression(s) / ED Diagnoses Final diagnoses:  None    Rx / DC Orders ED Discharge Orders     None         Theron Arista, Cordelia Poche 10/01/21 2047    Cheryll Cockayne, MD 10/10/21 2248

## 2021-10-01 NOTE — ED Triage Notes (Signed)
Reports cp and htn x 2-3 weeks.  Resp even and unlabored.  Skin warm and dry.  Nad.  See a couple of days for same.  ?

## 2021-10-01 NOTE — Discharge Instructions (Addendum)
Take Tylenol and Motrin for any pain, I think the chest pain is likely muscle. ?Take amlodipine in the morning, you may have some swelling in her ankles. ?Record your blood pressure readings. ?Follow-up with your primary care doctor. ?

## 2021-11-11 DIAGNOSIS — E669 Obesity, unspecified: Secondary | ICD-10-CM | POA: Diagnosis not present

## 2021-11-11 DIAGNOSIS — G4733 Obstructive sleep apnea (adult) (pediatric): Secondary | ICD-10-CM | POA: Diagnosis not present

## 2021-11-11 DIAGNOSIS — I10 Essential (primary) hypertension: Secondary | ICD-10-CM | POA: Diagnosis not present

## 2021-11-11 DIAGNOSIS — R5383 Other fatigue: Secondary | ICD-10-CM | POA: Diagnosis not present

## 2022-08-10 IMAGING — DX DG CHEST 2V
2 series · 2 of 2 positions shown · non-contrast
Comparison: Chest radiograph 09/29/2021

CLINICAL DATA: Chest pain.

EXAM:
CHEST - 2 VIEW

[chest pa]
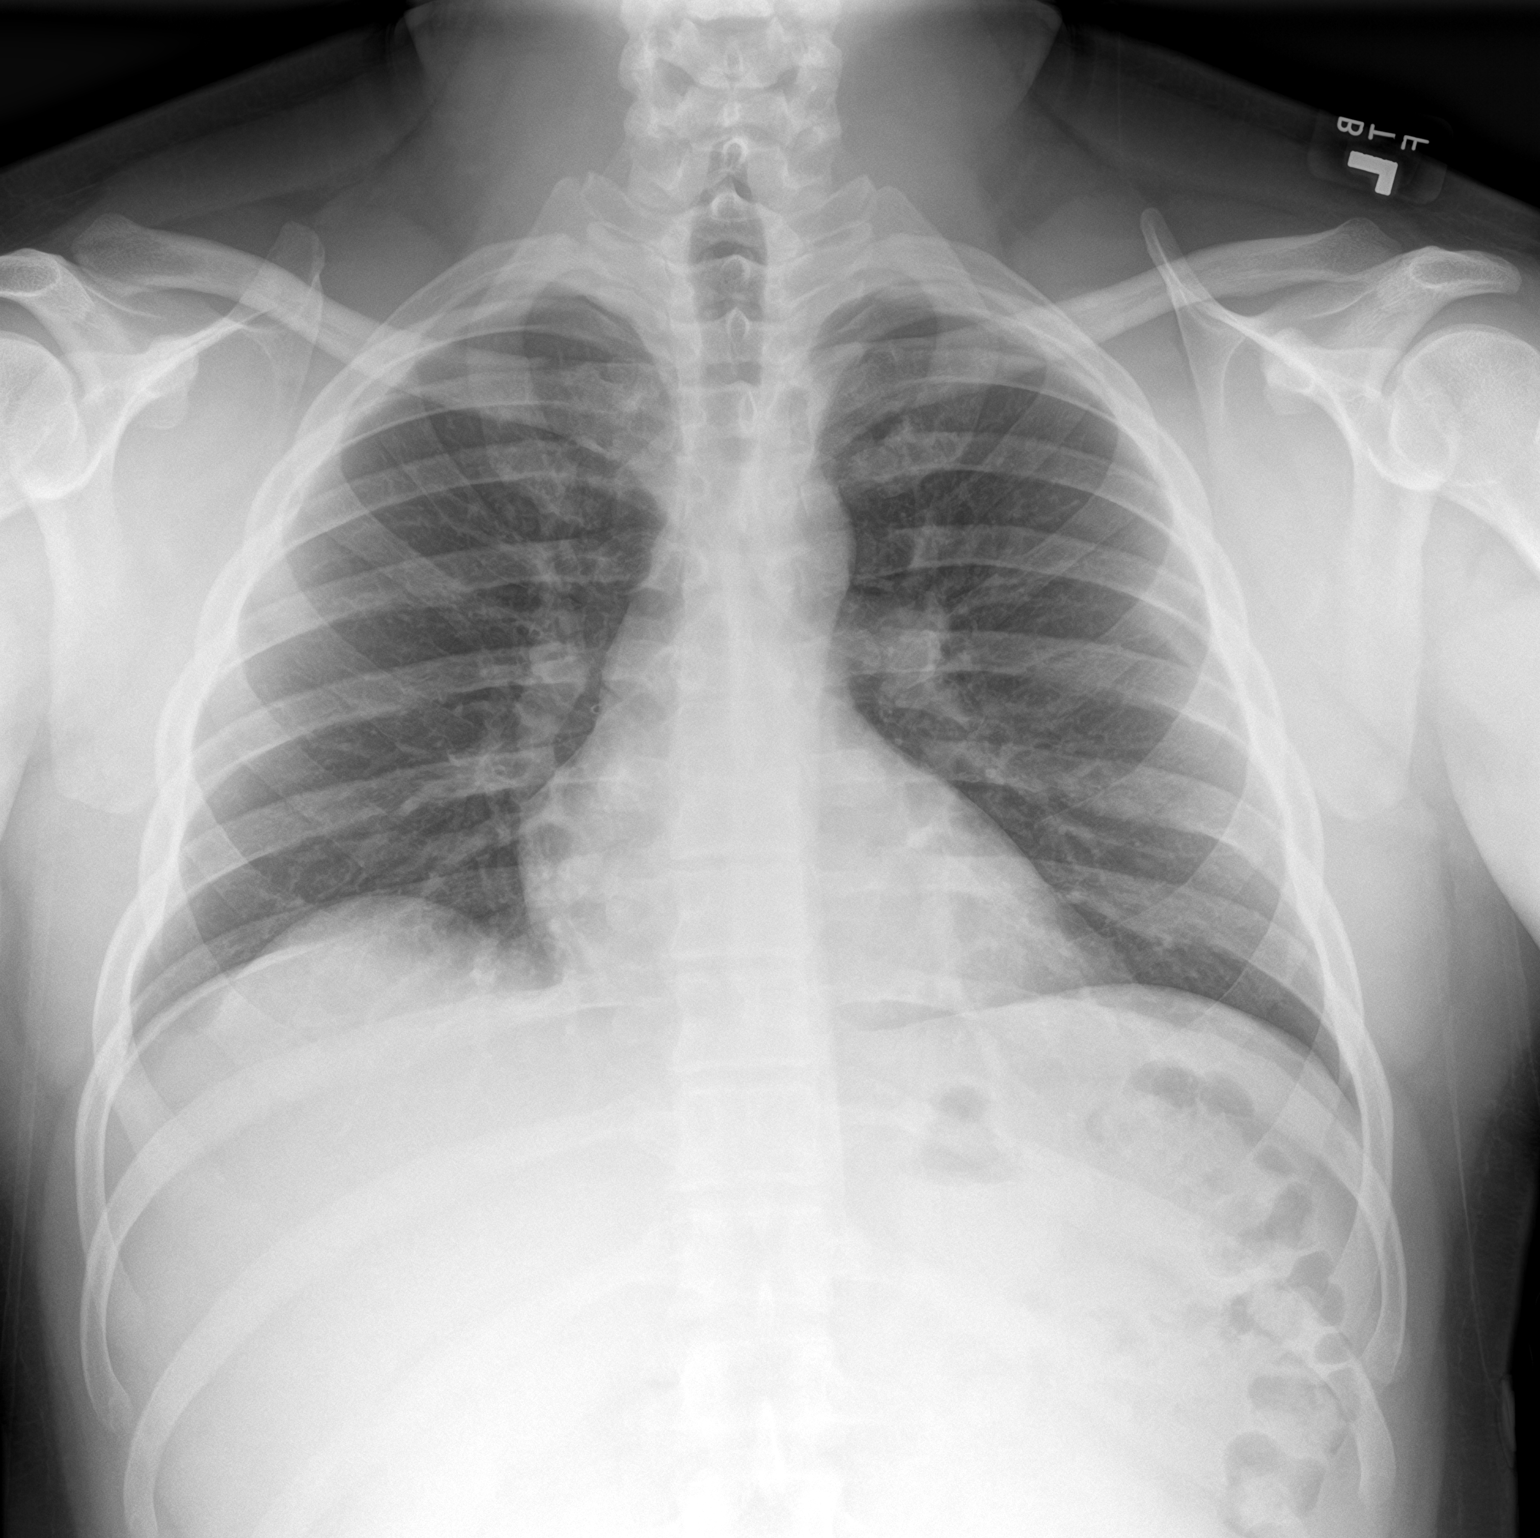

[chest lat]
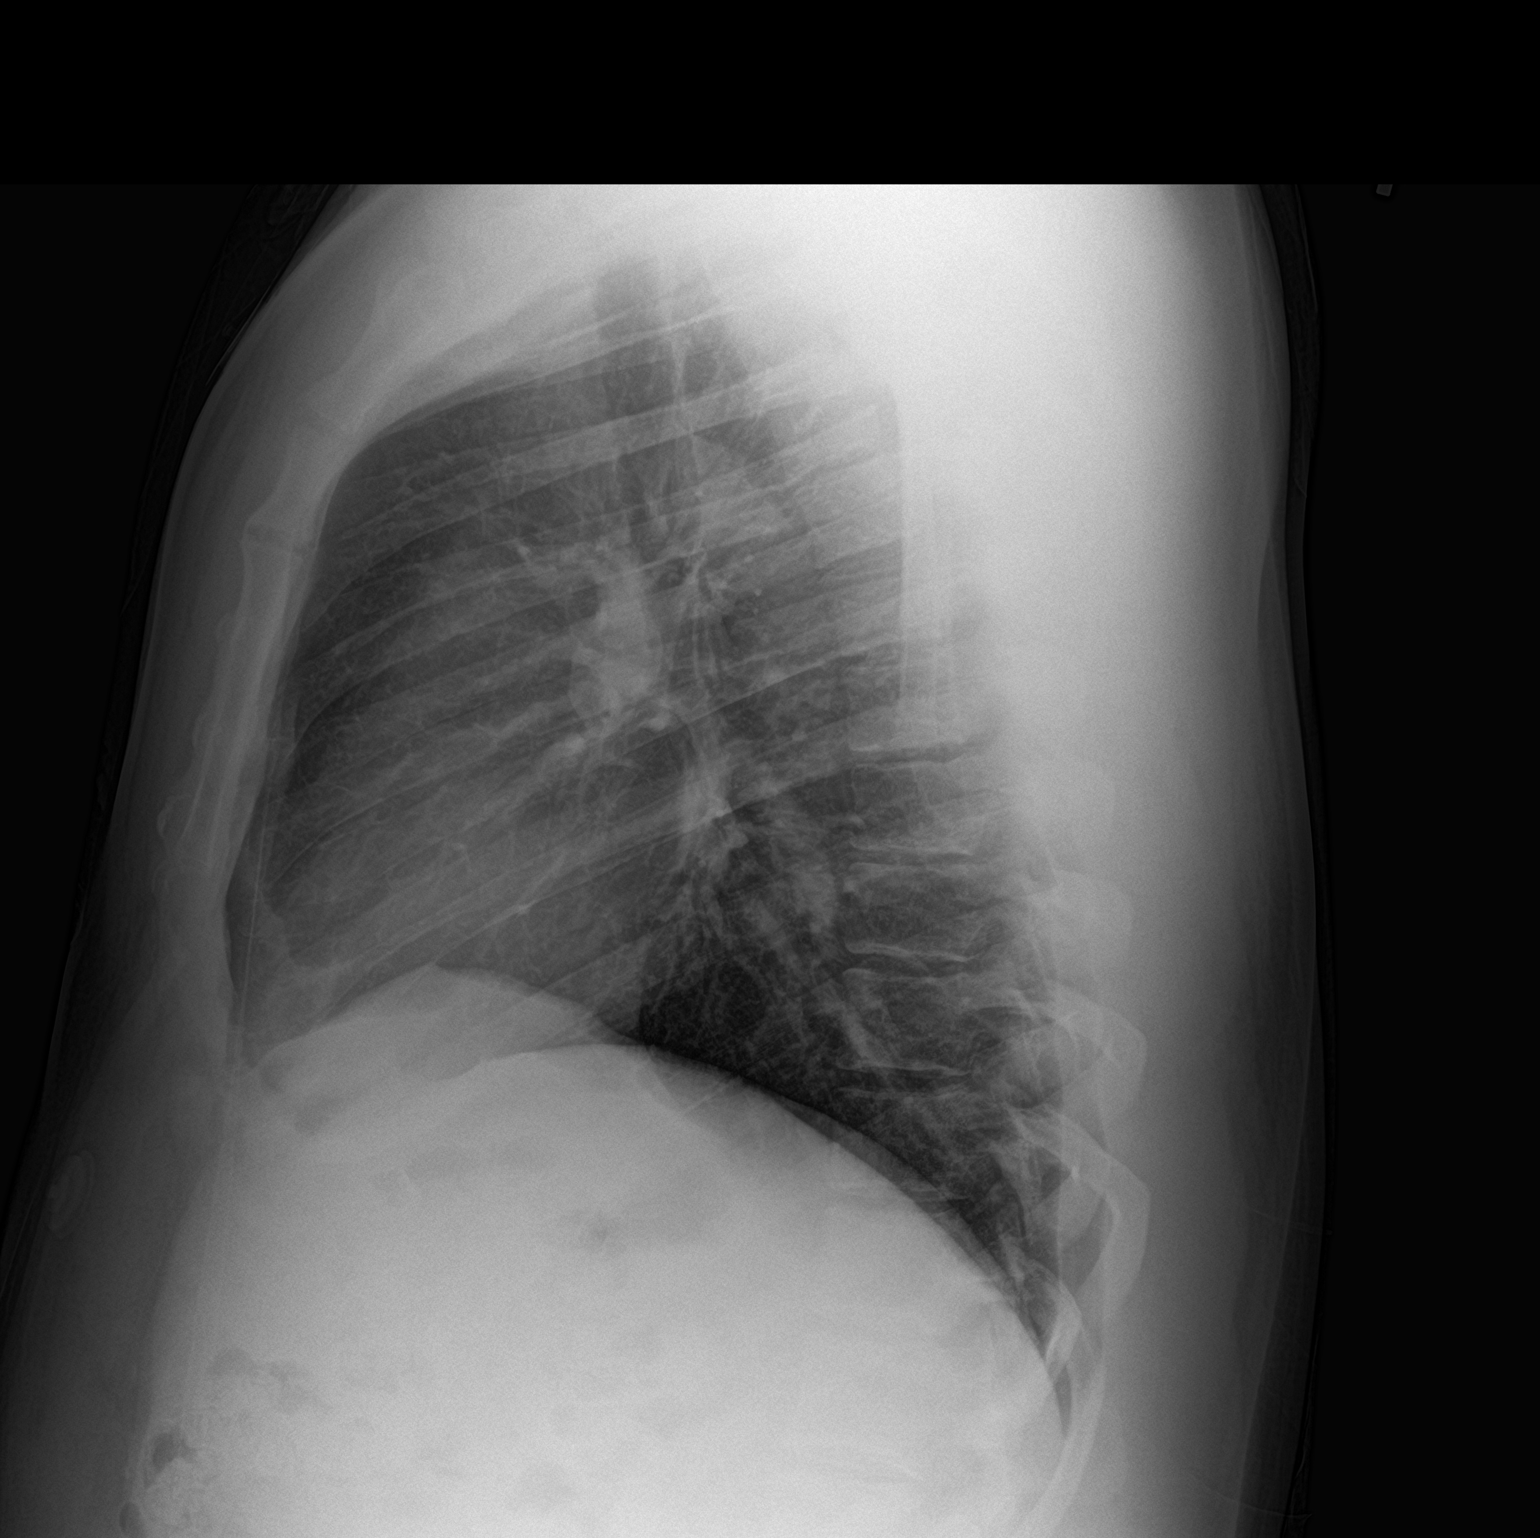

[2 of 2 positions shown; findings below may reference images not displayed]

FINDINGS: Both lungs are clear. Focal eventration along the anterior right
hemidiaphragm appears chronic. Heart and mediastinum are within
normal limits. Trachea is midline. Negative for a pneumothorax. No
acute bone abnormality.
IMPRESSION: No active cardiopulmonary disease.

## 2023-08-03 ENCOUNTER — Encounter (HOSPITAL_COMMUNITY): Payer: Self-pay | Admitting: Emergency Medicine

## 2023-08-03 ENCOUNTER — Emergency Department (HOSPITAL_COMMUNITY)
Admission: EM | Admit: 2023-08-03 | Discharge: 2023-08-03 | Disposition: A | Discharge status: Home / Self Care | Payer: Self-pay | Attending: Emergency Medicine | Admitting: Emergency Medicine

## 2023-08-03 ENCOUNTER — Other Ambulatory Visit: Payer: Self-pay

## 2023-08-03 DIAGNOSIS — M542 Cervicalgia: Secondary | ICD-10-CM | POA: Insufficient documentation

## 2023-08-03 MED ORDER — DOXYCYCLINE HYCLATE 100 MG PO CAPS: 100.0000 mg | ORAL_CAPSULE | Freq: Two times a day (BID) | ORAL | 0 refills | Status: DC

## 2023-08-03 MED ORDER — IBUPROFEN 800 MG PO TABS: 800.0000 mg | ORAL_TABLET | Freq: Three times a day (TID) | ORAL | 0 refills | Status: DC | PRN

## 2023-08-03 MED ORDER — DOXYCYCLINE HYCLATE 100 MG PO CAPS
100.0000 mg | ORAL_CAPSULE | Freq: Two times a day (BID) | ORAL | 0 refills | Status: DC
Start: 2023-08-03 — End: 2023-08-03

## 2023-08-03 NOTE — ED Triage Notes (Signed)
 Pt c/o of sharp pain in his right neck at the base of his skull occasionally radiating into the right temporal region of his skull. Pt denies any unusual recent activity or injury to the area. Pt reports pain goes away when he is sleeping. Pt is hypertensive in triage.

## 2023-08-03 NOTE — ED Provider Notes (Signed)
 [facility] EMERGENCY DEPARTMENT AT [facility] Provider Note   CSN: [id number] Arrival date & time: 08/03/23  0631     History {Add pertinent medical, surgical, social history, OB history to HPI:1} Chief Complaint  Patient presents with   Neck Pain    Jonathan Solomon is a 23 y.o. male.  Patient with neck pain.   Neck Pain      Home Medications Prior to Admission medications   Medication Sig Start Date End Date Taking? Authorizing Provider  amLODipine (NORVASC) 5 MG tablet Take 1 tablet (5 mg total) by mouth daily. 10/01/21   Theron Arista, PA-C  doxycycline (VIBRAMYCIN) 100 MG capsule Take 1 capsule (100 mg total) by mouth 2 (two) times daily. One po bid x 7 days 08/03/23   Bethann Berkshire, MD  ibuprofen (ADVIL) 800 MG tablet Take 1 tablet (800 mg total) by mouth every 8 (eight) hours as needed. 08/03/23   Bethann Berkshire, MD  meloxicam (MOBIC) 7.5 MG tablet Take by mouth. 09/29/21   [provider]      Allergies    Strawberry extract and Chocolate    Review of Systems   Review of Systems  Musculoskeletal:  Positive for neck pain.    Physical Exam Updated Vital Signs BP (!) 170/103 (BP Location: Right Arm)   Pulse 87   Temp 99.1 F (37.3 C) (Oral)   Resp 18   Ht 5\' 6"  (1.676 m)   Wt 102.1 kg   SpO2 98%   BMI 36.32 kg/m  Physical Exam  ED Results / Procedures / Treatments   Labs (all labs ordered are listed, but only abnormal results are displayed) Labs Reviewed - No data to display  EKG None  Radiology No results found.  Procedures Procedures  {Document cardiac monitor, telemetry assessment procedure when appropriate:1}  Medications Ordered in ED Medications - No data to display  ED Course/ Medical Decision Making/ A&P   {   Click here for ABCD2, HEART and other calculatorsREFRESH Note before signing :1}                              Medical Decision Making Risk Prescription drug management.   Patient with right lateral  posterior neck pain.  He is placed on Motrin and Doxy  {Document critical care time when appropriate:1} {Document review of labs and clinical decision tools ie heart score, Chads2Vasc2 etc:1}  {Document your independent review of radiology images, and any outside records:1} {Document your discussion with family members, caretakers, and with consultants:1} {Document social determinants of health affecting pt's care:1} {Document your decision making why or why not admission, treatments were needed:1} Final Clinical Impression(s) / ED Diagnoses Final diagnoses:  Neck pain    Rx / DC Orders ED Discharge Orders          Ordered    ibuprofen (ADVIL) 800 MG tablet  Every 8 hours PRN,   Status:  Discontinued        08/03/23 0915    doxycycline (VIBRAMYCIN) 100 MG capsule  2 times daily,   Status:  Discontinued        08/03/23 0915    doxycycline (VIBRAMYCIN) 100 MG capsule  2 times daily,   Status:  Discontinued        08/03/23 0919    ibuprofen (ADVIL) 800 MG tablet  Every 8 hours PRN,   Status:  Discontinued        08/03/23  0919    doxycycline (VIBRAMYCIN) 100 MG capsule  2 times daily        08/03/23 0920    ibuprofen (ADVIL) 800 MG tablet  Every 8 hours PRN        08/03/23 0920

## 2023-08-03 NOTE — Discharge Instructions (Signed)
 Follow-up next week if not improving.  And follow-up in 2 to 3 weeks for your blood pressure to be checked

## 2024-01-20 ENCOUNTER — Encounter: Payer: Self-pay | Admitting: General Practice

## 2024-03-30 ENCOUNTER — Ambulatory Visit: Payer: Self-pay | Attending: Internal Medicine | Admitting: Internal Medicine

## 2024-03-30 ENCOUNTER — Encounter: Payer: Self-pay | Admitting: Internal Medicine

## 2024-03-30 VITALS — BP 132/78 | HR 61 | Ht 67.0 in | Wt 227.0 lb

## 2024-03-30 DIAGNOSIS — I10 Essential (primary) hypertension: Secondary | ICD-10-CM | POA: Diagnosis not present

## 2024-03-30 DIAGNOSIS — Z01810 Encounter for preprocedural cardiovascular examination: Secondary | ICD-10-CM

## 2024-03-30 DIAGNOSIS — R079 Chest pain, unspecified: Secondary | ICD-10-CM | POA: Insufficient documentation

## 2024-03-30 DIAGNOSIS — Q251 Coarctation of aorta: Secondary | ICD-10-CM | POA: Diagnosis not present

## 2024-03-30 NOTE — Patient Instructions (Addendum)
 Medication Instructions:  Your physician recommends that you continue on your current medications as directed. Please refer to the Current Medication list given to you today.   Labwork: BMET to be completed in 1-2 weeks prior to CTA  Testing/Procedures: Your physician has requested that you have an echocardiogram. Echocardiography is a painless test that uses sound waves to create images of your heart. It provides your doctor with information about the size and shape of your heart and how well your heart's chambers and valves are working. This procedure takes approximately one hour. There are no restrictions for this procedure. Please do NOT wear cologne, perfume, aftershave, or lotions (deodorant is allowed). Please arrive 15 minutes prior to your appointment time.  Please note: We ask at that you not bring children with you during ultrasound (echo/ vascular) testing. Due to room size and safety concerns, children are not allowed in the ultrasound rooms during exams. Our front office staff cannot provide observation of children in our lobby area while testing is being conducted. An adult accompanying a patient to their appointment will only be allowed in the ultrasound room at the discretion of the ultrasound technician under special circumstances. We apologize for any inconvenience.  Non-Cardiac CT Angiography (CTA), is a special type of CT scan that uses a computer to produce multi-dimensional views of major blood vessels throughout the body. In CT angiography, a contrast material is injected through an IV to help visualize the blood vessels   Follow-Up: Your physician recommends that you schedule a follow-up appointment in: 3 months   Any Other Special Instructions Will Be Listed Below (If Applicable). Thank you for choosing [facility]!     If you need a refill on your cardiac medications before your next appointment, please call your pharmacy.

## 2024-03-30 NOTE — Progress Notes (Signed)
 Jonathan Solomon

## 2024-03-30 NOTE — Progress Notes (Signed)
 Cardiology Office Note  Date: 03/30/2024   ID: Jonathan Solomon, DOB 10-06-2000, MRN [id number]  PCP:  Trudy Vaughn FALCON, MD  Cardiologist:  Diannah SHAUNNA Maywood, MD Electrophysiologist:  None   History of Present Illness: Jonathan Solomon is a 23 y.o. male  Referred to cardiology clinic for evaluation of chest pain.  Patient was newly diagnosed with HTN when he was in elementary school.  He reported that he was seen by pediatric cardiologist in [place] many years ago.  Did not undergo procedures.  He reported having exertional chest pains lasting for about a minute.  Occurs only with exertion.  No other symptoms of DOE, dizziness, syncope, palpitations.  Past Medical History:  Diagnosis Date   Hypertension    Urticaria    History reviewed. No pertinent surgical history.  Current Outpatient Medications  Medication Sig Dispense Refill   amLODipine  (NORVASC ) 10 MG tablet Take 10 mg by mouth daily.     ibuprofen  (ADVIL ) 800 MG tablet Take 1 tablet (800 mg total) by mouth every 8 (eight) hours as needed. 21 tablet 0   omega-3 acid ethyl esters (LOVAZA) 1 g capsule Take 2 capsules by mouth 2 (two) times daily.     No current facility-administered medications for this visit.   Allergies:  Strawberry extract and Chocolate   Social History: The patient  reports that he has been smoking cigars. He has never used smokeless tobacco. He reports current drug use. Drug: Marijuana. He reports that he does not drink alcohol.   Family History: The patient's family history includes Asthma in his paternal grandmother; Diabetes in his paternal grandmother; Heart attack in his father; Hypertension in his father.   ROS:  Please see the history of present illness. Otherwise, complete review of systems is positive for none  All other systems are reviewed and negative.   Physical Exam: VS:  BP 132/78 (BP Location: Right Arm, Cuff Size: Large)   Pulse 61   Ht 5' 7 (1.702 m)   Wt 227 lb (103 kg)    BMI 35.55 kg/m , BMI Body mass index is 35.55 kg/m.  Wt Readings from Last 3 Encounters:  03/30/24 227 lb (103 kg)  08/03/23 225 lb (102.1 kg)  10/01/21 228 lb 3 oz (103.5 kg)    General: Patient appears comfortable at rest. HEENT: Conjunctiva and lids normal, oropharynx clear with moist mucosa. Neck: Supple, no elevated JVP or carotid bruits, no thyromegaly. Lungs: Clear to auscultation, nonlabored breathing at rest. Cardiac: Regular rate and rhythm, no S3 or significant systolic murmur, no pericardial rub. Abdomen: Soft, nontender, no hepatomegaly, bowel sounds present, no guarding or rebound. Extremities: No pitting edema, distal pulses 2+. Skin: Warm and dry. Musculoskeletal: No kyphosis. Neuropsychiatric: Alert and oriented x3, affect grossly appropriate.  Recent Labwork: No results found for requested labs within last 365 days.  No results found for: CHOL, TRIG, HDL, CHOLHDL, VLDL, LDLCALC, LDLDIRECT   Assessment and Plan:  HTN, partially controlled - Newly diagnosed with HTN when he was in elementary school, per patient. - Continue amlodipine  10 mg once daily. - I reviewed his BP log when he was on amlodipine  5 mg.  BP log showed blood pressures ranging from 130 to 180 mm Hg. after amlodipine  dose was increased from 5 mg to 10 mg once daily, his blood pressure seem to be controlled according to the patient.  Did not bring the BP log today.  Strong encouraged him to check blood pressures twice daily, in a.m and  p.m. and bring the log to the next office visit. - Obtain echocardiogram. - Obtain CT angio chest/aorta to rule out coarctation of aorta.  If coarctation of aorta is ruled out, will obtain ETT for further evaluation of chest pain.  Chest pain - Reported having exertional chest pain lasting for about a minute.  Had to even stop intercourse with his girlfriend due to chest pains.  If coarctation of aorta is ruled out, will obtain ETT for further  evaluation.   40 minutes spent in reviewing prior medical records, more than 3 labs, discussion and documentation.  Medication Adjustments/Labs and Tests Ordered: Current medicines are reviewed at length with the patient today.  Concerns regarding medicines are outlined above.    Disposition:  Follow up 3 months  Signed Olney Monier Priya Kenji Mapel, MD, 03/30/2024 2:54 PM    [facility] [address] [place], [place], [place] [address]

## 2024-04-12 ENCOUNTER — Ambulatory Visit

## 2024-04-17 ENCOUNTER — Ambulatory Visit: Attending: Internal Medicine

## 2024-04-26 ENCOUNTER — Telehealth: Payer: Self-pay | Admitting: *Deleted

## 2024-04-26 ENCOUNTER — Ambulatory Visit: Admitting: Gastroenterology

## 2024-04-26 ENCOUNTER — Encounter: Payer: Self-pay | Admitting: Gastroenterology

## 2024-04-26 VITALS — BP 149/88 | HR 74 | Temp 98.0°F | Ht 66.0 in | Wt 227.2 lb

## 2024-04-26 DIAGNOSIS — K7581 Nonalcoholic steatohepatitis (NASH): Secondary | ICD-10-CM | POA: Diagnosis not present

## 2024-04-26 DIAGNOSIS — K439 Ventral hernia without obstruction or gangrene: Secondary | ICD-10-CM | POA: Diagnosis not present

## 2024-04-26 DIAGNOSIS — Z6836 Body mass index (BMI) 36.0-36.9, adult: Secondary | ICD-10-CM | POA: Diagnosis not present

## 2024-04-26 DIAGNOSIS — E66812 Obesity, class 2: Secondary | ICD-10-CM | POA: Diagnosis not present

## 2024-04-26 NOTE — Patient Instructions (Addendum)
 Instructions for fatty liver: Recommend 1-2# weight loss per week until ideal body weight through exercise & diet. Low fat/cholesterol diet.   Avoid sweets, sodas, fruit juices, sweetened beverages like tea, etc. Gradually increase exercise from 15 min daily up to 1 hr per day 5 days/week. Limit alcohol use.  I have given you handout today about the labs I have ordered and about fatty liver and the Mediterranean diet I discussed with you.  As we also discussed your A1c is a 54-month average of your blood sugars and that is something primary care regularly checks for screening for diabetes.  Also as we discussed treatment for hypertriglycerides and sometimes even blood pressure medications and cholesterol medications can cause some mild elevation in liver enzymes as well.  After I receive your labs that you will have done this week as well as compare them to the labs you had done in August then we will see what else we need to do in regards to monitoring or treatment.  If you meet criteria for some mild advanced fibrosis in qualify for medication to treat fibrosis then we can consider submitting for that if labs indicate the need  Monitor for worsening abdominal pain in the upper abdomen.   Tentatively we will follow-up in 3 months, sooner if needed.  It was a pleasure to see you today. I want to create trusting relationships with patients. If you receive a survey regarding your visit,  I greatly appreciate you taking time to fill this out on paper or through your MyChart. I value your feedback.  Charmaine Melia, MSN, FNP-BC, AGACNP-BC [facility]

## 2024-04-26 NOTE — Telephone Encounter (Signed)
 Pt called and wants to know if you could send a prescription to [facility] for a blood sugar machine. He states he can not afford one. And wants to see if his insurance will pay for it.

## 2024-04-26 NOTE — Progress Notes (Signed)
 GI Office Note    Referring Provider: Trudy Vaughn FALCON, MD Primary Care Physician:  Trudy Vaughn FALCON, MD  Primary Gastroenterologist: Carlin POUR. Cindie, DO  Chief Complaint   Chief Complaint  Patient presents with   fattty liver     Fatty liver    History of Present Illness   Jonathan Solomon is a 23 y.o. male presenting today at the request of Trudy Vaughn FALCON, MD for evaluation of fatty liver.  Per review of referral paperwork he is being referred for fatty liver.  He had a visit with PCP 11//2025 where his BP was still noted to be elevated.  Notes a history of hypertriglyceridemia and was taking Lovaza and had lost 10 pounds.  Reports a history of fatty liver noted on ultrasound with abnormal AST and ALT.  Denies any intake of breads, soda, or fried foods.  Is snoring at night and sleepy during the day.  He was referred for sleep study and referred to GI given fatty liver.  Abdominal ultrasound completed 01/21/2024 with the liver was noted to be 16 cm in overall length and diffusely echogenic and indicating fatty liver changes without any focal liver lesions and no biliary ductal dilation.  Gallbladder was within normal limits.  Unremarkable spleen and pancreas  Labs reviewed within [facility] database and 01/19/2024 he had a negative acute hepatitis panel.  Last CMP that I have on file was from May 2023 with ALT 84, AST 28, alk phos 70, T. bili 0.3.  Seen by cardiology 03/30/2024 for evaluation of chest pain.  He reported he was diagnosed with hypertension in elementary school and was seen by pediatric cardiologist many years prior and did not undergo any procedures.  Chest pain only occurs with exertion and denied any dizziness, syncope, or palpitations.  He was advised continue amlodipine  10 mg daily.  Echocardiogram and CT angio chest/aorta to rule out coarctation of aorta.  Advised if coarctation of aorta is ruled out then they would consider ETT for further evaluation of his chest  pain.  Today:  Discussed the use of AI scribe software for clinical note transcription with the patient, who gave verbal consent to proceed.  He has a history of hypertension since childhood and experiences an elevated heart rate and chest pain with activity. No significant shortness of breath is noted. He is under the care of a cardiologist for these cardiovascular issues. Has high triglycerides and hypertension as well as excess weight.   He has successfully reduced his weight from 227 pounds to 220 pounds, although he gained a few pounds over Thanksgiving. His dietary modifications include consuming baked foods, avoiding fried foods, and increasing intake of greens and beans. He drinks water and occasionally Kool-Aid, avoiding sodas and sweets.   Physical activity is limited due to chest pain and elevated heart rate during exertion, such as during sexual activity or heavy lifting. 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  He has a mild enlarged thyroid diagnosed by his primary care provider, but has not received further management for this condition. He experiences snoring.  He experiences intermittent right upper quadrant abdominal pain, particularly when sitting for long periods. The pain is described as coming and going and is not associated with specific foods. No changes in bowel habits are reported.  Family history includes a father with a heart attack and hypertension, and a grandmother with diabetes and asthma. There is  no known family history of liver or gallbladder disease, colon polyps, or colon cancer.      Wt Readings from Last 6 Encounters:  04/26/24 227 lb 3.2 oz (103.1 kg)  03/30/24 227 lb (103 kg)  08/03/23 225 lb (102.1 kg)  10/01/21 228 lb 3 oz (103.5 kg)  05/11/20 210 lb (95.3 kg)  04/20/19 193 lb (87.5 kg)    Body mass index is 36.67 kg/m.  Current Outpatient Medications  Medication  Sig Dispense Refill   amLODipine  (NORVASC ) 10 MG tablet Take 10 mg by mouth daily.     omega-3 acid ethyl esters (LOVAZA) 1 g capsule Take 2 capsules by mouth 2 (two) times daily.     ibuprofen  (ADVIL ) 800 MG tablet Take 1 tablet (800 mg total) by mouth every 8 (eight) hours as needed. 21 tablet 0   rosuvastatin (CRESTOR) 10 MG tablet Take 10 mg by mouth at bedtime. (Patient not taking: Reported on 04/26/2024)     No current facility-administered medications for this visit.    Past Medical History:  Diagnosis Date   Hypertension    Urticaria     No past surgical history on file.  Family History  Problem Relation Age of Onset   Hypertension Father    Heart attack Father    Asthma Paternal Grandmother    Diabetes Paternal Grandmother     Allergies as of 04/26/2024 - Review Complete 04/26/2024  Allergen Reaction Noted   Strawberry extract Swelling 04/20/2019   Chocolate Rash 02/13/2011    Social History   Socioeconomic History   Marital status: Single    Spouse name: Not on file   Number of children: Not on file   Years of education: Not on file   Highest education level: Not on file  Occupational History   Not on file  Tobacco Use   Smoking status: Some Days    Types: Cigars   Smokeless tobacco: Never  Vaping Use   Vaping status: Never Used  Substance and Sexual Activity   Alcohol use: No   Drug use: Yes    Types: Marijuana   Sexual activity: Not on file  Other Topics Concern   Not on file  Social History Narrative   Not on file   Social Drivers of Health   Financial Resource Strain: Not on file  Food Insecurity: Not on file  Transportation Needs: Not on file  Physical Activity: Not on file  Stress: Not on file  Social Connections: Not on file  Intimate Partner Violence: Not on file    Review of Systems   Gen: Denies any fever, chills, fatigue, weight loss, lack of appetite.  CV: Denies chest pain, heart palpitations, peripheral edema, syncope.   Resp: Denies shortness of breath at rest or with exertion. Denies wheezing or cough.  GI: see HPI GU : Denies urinary burning, urinary frequency, urinary hesitancy MS: Denies joint pain, muscle weakness, cramps, or limitation of movement.  Derm: Denies rash, itching, dry skin Psych: Denies depression, anxiety, memory loss, and confusion Heme: Denies bruising, bleeding, and enlarged lymph nodes.  Physical Exam   BP (!) 149/88 (BP Location: Left Arm, Patient Position: Sitting, Cuff Size: Large)   Pulse 74   Temp 98 F (36.7 C) (Temporal)   Ht 5' 6 (1.676 m)   Wt 227 lb 3.2 oz (103.1 kg)   BMI 36.67 kg/m   General:   Alert and oriented. Pleasant and cooperative. Well-nourished and well-developed.  Head:  Normocephalic and atraumatic. Eyes:  Without icterus, sclera clear and conjunctiva pink.  Ears:  Normal auditory acuity. Mouth:  No deformity or lesions, oral mucosa pink.  Abdomen:  +BS, soft, and non-distended.  Mild TTP just to the right of midline of the abdomen with us  light void and muscular tissue in this area concerning for possible hernia although no obvious obstruction or protrusion of tissue.  Mild hepatomegaly, palpable Rectal:  deferred Msk:  Symmetrical without gross deformities. Normal posture. Extremities:  Without edema. Neurologic:  Alert and  oriented x4;  grossly normal neurologically. Skin:  Intact without significant lesions or rashes. Psych:  Alert and cooperative. Normal mood and affect.  Assessment & Plan   Jonathan Solomon is a 23 y.o. male with a history of hypertension, hypertriglyceridemia, anxiety presenting today  for evaluation of fatty liver.    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 Potential contribution from thyroid dysfunction and sleep apnea. Previous labs from 2023 showed elevated liver enzymes (ALT), but current levels are unknown.  Negative hepatitis panel. Potential for liver fibrosis and progression to cirrhosis if not managed. Medications for hypertension and cholesterol may cause mild liver enzyme elevation. Will need to calculate Fib-$ and compare with other NITs. Per recent US  he does have fatty liver present.  - Ordered labs - CBC, CMP, INR, ELF, Fibrosure, Ferritin - Provided education on diet and exercise for liver health. (Mediterranean diet) - Requested previous lab results for comparison. - Monitor for progression of liver fibrosis. - If advanced fibrosis noted (F2-F3 then will consider Rezdiffra)  Abdominal wall hernia Possible abdominal wall hernia with pain in the right upper abdomen, exacerbated by certain positions and activities. No visible protrusion, but potential muscle separation noted. Risk of worsening with weight and pressure. - Advised weight loss and dietary changes to reduce abdominal pressure. - Monitor for worsening symptoms or changes in bowel habits. - Discussed potential for surgical intervention if hernia worsens.  Obesity Contributing to MASLD/MASH and hypertension. Current weight is 227 lbs, with recent weight gain after losing 10 lbs. Dietary changes include increased intake of baked foods, greens, and water, with reduced fried foods and sweets. Limited aerobic exercise due to chest pain and elevated heart rate. - Encouraged continuation of dietary changes and weight loss efforts. - Advised on increasing aerobic exercise as tolerated. - Discussed potential benefits of a Mediterranean diet.      Follow up   Follow up 3 months, sooner if needed.   Charmaine Melia, MSN, FNP-BC, AGACNP-BC [facility]

## 2024-04-27 ENCOUNTER — Encounter: Payer: Self-pay | Admitting: *Deleted

## 2024-04-28 ENCOUNTER — Telehealth: Payer: Self-pay | Admitting: Gastroenterology

## 2024-04-28 NOTE — Telephone Encounter (Signed)
 Sent pt a [employer]ologist. He responded.

## 2024-04-28 NOTE — Telephone Encounter (Signed)
 Labs from 01/12/2024 with primary care reviewed:  hemoglobin 15.9 CMP unremarkable other than AST of 44, ALT 77, normal T. bili and alkaline phosphatase A1c 5.5 lipid panel with cholesterol 253, HDL 33, triglycerides greater than 525 vitamin D low at 20.6 normal TSH

## 2024-05-02 LAB — NASH FIBROSURE(R) PLUS

## 2024-05-02 NOTE — Telephone Encounter (Signed)
Noted. Informed pt.  

## 2024-05-03 ENCOUNTER — Ambulatory Visit: Payer: Self-pay | Admitting: Gastroenterology

## 2024-05-03 LAB — COMPREHENSIVE METABOLIC PANEL WITH GFR
ALT: 43 IU/L (ref 0–44)
AST: 22 IU/L (ref 0–40)
Albumin: 5 g/dL (ref 4.3–5.2)
Alkaline Phosphatase: 78 IU/L (ref 47–123)
BUN/Creatinine Ratio: 13 (ref 9–20)
BUN: 15 mg/dL (ref 6–20)
Bilirubin Total: 0.2 mg/dL (ref 0.0–1.2)
CO2: 24 mmol/L (ref 20–29)
Calcium: 10 mg/dL (ref 8.7–10.2)
Chloride: 100 mmol/L (ref 96–106)
Creatinine, Ser: 1.18 mg/dL (ref 0.76–1.27)
Globulin, Total: 2.8 g/dL (ref 1.5–4.5)
Glucose: 107 mg/dL — ABNORMAL HIGH (ref 70–99)
Potassium: 4.7 mmol/L (ref 3.5–5.2)
Sodium: 140 mmol/L (ref 134–144)
Total Protein: 7.8 g/dL (ref 6.0–8.5)
eGFR: 88 mL/min/1.73 (ref >59)

## 2024-05-03 LAB — CBC
Hematocrit: 46.7 % (ref 37.5–51.0)
Hemoglobin: 15 g/dL (ref 13.0–17.7)
MCH: 28.3 pg (ref 26.6–33.0)
MCHC: 32.1 g/dL (ref 31.5–35.7)
MCV: 88 fL (ref 79–97)
Platelets: 262 x10E3/uL (ref 150–450)
RBC: 5.3 x10E6/uL (ref 4.14–5.80)
RDW: 13.4 % (ref 11.6–15.4)
WBC: 8.7 x10E3/uL (ref 3.4–10.8)

## 2024-05-03 LAB — NASH FIBROSURE(R) PLUS
ALPHA 2-MACROGLOBULINS, QN: 91 mg/dL — AB (ref 110–276)
ALT (SGPT) P5P: 51 IU/L (ref 0–55)
AST (SGOT) P5P: 29 IU/L (ref 0–40)
Apolipoprotein A-1: 127 mg/dL (ref 101–178)
Bilirubin, Total: <0.2 mg/dL (ref 0.0–1.2)
Cholesterol, Total: 275 mg/dL — AB (ref 100–199)
Fibrosis Score: 0.02 (ref 0.00–0.21)
GGT: 27 IU/L (ref 0–65)
Glucose: 113 mg/dL — AB (ref 70–99)
Haptoglobin: 143 mg/dL (ref 17–317)
NASH Score: 0.23 (ref 0.00–0.25)
Steatosis Score: 0.77 — AB (ref 0.00–0.40)
Triglycerides: 521 mg/dL — AB (ref 0–149)

## 2024-05-03 LAB — ENHANCED LIVER FIBROSIS (ELF): ELF(TM) Score: 8.15 (ref <9.80)

## 2024-05-03 LAB — PROTIME-INR
INR: 0.9 (ref 0.9–1.2)
Prothrombin Time: 9.9 s (ref 9.1–12.0)

## 2024-05-03 LAB — FERRITIN: Ferritin: 271 ng/mL (ref 30–400)

## 2024-05-10 ENCOUNTER — Ambulatory Visit: Attending: Internal Medicine

## 2024-05-10 ENCOUNTER — Telehealth: Payer: Self-pay | Admitting: Internal Medicine

## 2024-05-10 DIAGNOSIS — R079 Chest pain, unspecified: Secondary | ICD-10-CM

## 2024-05-10 NOTE — Telephone Encounter (Signed)
 Left message for patient to call the office.

## 2024-05-10 NOTE — Telephone Encounter (Signed)
 Pt is asking if he is suppose to have a sleep study done and when if so ?

## 2024-05-11 LAB — ECHOCARDIOGRAM COMPLETE
AR max vel: 2.46 cm2
AV Peak grad: 11.4 mmHg
AV Vena cont: 0.6 cm
Ao pk vel: 1.69 m/s
Area-P 1/2: 4.3 cm2
Calc EF: 63.8 %
P 1/2 time: 564 ms
S' Lateral: 3 cm
Single Plane A2C EF: 58 %
Single Plane A4C EF: 68.7 %

## 2024-05-11 NOTE — Telephone Encounter (Signed)
 Spoke with patient regarding Sleep study. Advised him that Dr. Mallipeddi hasn't ordered a test for Sleep Study. He reviewed paper work and was [employer] advised him to contact them. Also let him know his CT was cancelled insurance denied and Dr. Stacia recommended Echo, but patient has already completed Echo that she ordered at his last visit. Patient verbalized understanding

## 2024-05-11 NOTE — Telephone Encounter (Signed)
 Patient was returning call. Please advise ?

## 2024-05-12 ENCOUNTER — Ambulatory Visit (HOSPITAL_COMMUNITY)

## 2024-05-19 ENCOUNTER — Ambulatory Visit: Payer: Self-pay | Admitting: Internal Medicine

## 2024-05-22 NOTE — Telephone Encounter (Signed)
-----   Message from Vishnu Mallipeddi, MD sent at 05/19/2024  3:00 PM EST ----- Normal LV function, normal diastology, normal RV function, mild pulmonary hypertension, aortic valve has indeterminant number of cusps, possibly bicuspid aortic valve with mild to moderate aortic  valve regurgitation, eccentric jet directed towards the interventricular septum with no AS, CVP 3 mmHg.  No evidence of coarctation of aorta.  Abnormal echocardiogram.  Recommend aggressive HTN control.  Need to discuss echocardiogram results in person. Needs TEE versus CT cardiac/CTA aorta to confirm bicuspid aortic valve and rule out  aortopathy.  Will need to discuss in the clinic visit.

## 2024-05-22 NOTE — Telephone Encounter (Signed)
 The patient has been notified of the result and verbalized understanding.  All questions (if any) were answered. Patient is aware of appointment and will keep it to discuss results Littie CHRISTELLA Croak, CMA 05/22/2024 2:16 PM

## 2024-05-31 ENCOUNTER — Encounter (HOSPITAL_BASED_OUTPATIENT_CLINIC_OR_DEPARTMENT_OTHER): Payer: Self-pay | Admitting: Pulmonary Disease

## 2024-05-31 DIAGNOSIS — R0683 Snoring: Secondary | ICD-10-CM

## 2024-05-31 DIAGNOSIS — R5383 Other fatigue: Secondary | ICD-10-CM

## 2024-06-20 ENCOUNTER — Ambulatory Visit (HOSPITAL_BASED_OUTPATIENT_CLINIC_OR_DEPARTMENT_OTHER): Admitting: Pulmonary Disease

## 2024-06-26 ENCOUNTER — Ambulatory Visit (HOSPITAL_BASED_OUTPATIENT_CLINIC_OR_DEPARTMENT_OTHER): Admitting: Pulmonary Disease

## 2024-07-10 ENCOUNTER — Ambulatory Visit: Admitting: Internal Medicine

## 2024-07-25 ENCOUNTER — Ambulatory Visit (HOSPITAL_BASED_OUTPATIENT_CLINIC_OR_DEPARTMENT_OTHER): Admitting: Pulmonary Disease

## 2024-07-27 ENCOUNTER — Ambulatory Visit: Admitting: Gastroenterology
